# Patient Record
Sex: Female | Born: 2000 | Race: Black or African American | Hispanic: No | Marital: Single | Smoking: Never smoker
Health system: Southern US, Community
[De-identification: ages and names within clinical notes are randomized; demographics above are authoritative.]

## PROBLEM LIST (undated history)

## (undated) DIAGNOSIS — F419 Anxiety disorder, unspecified: Secondary | ICD-10-CM

## (undated) HISTORY — PX: OTHER SURGICAL HISTORY: SHX169

## (undated) HISTORY — PX: NO PAST SURGERIES: SHX2092

## (undated) HISTORY — PX: WISDOM TOOTH EXTRACTION: SHX21

## (undated) HISTORY — DX: Anxiety disorder, unspecified: F41.9

---
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
## 2019-06-10 ENCOUNTER — Other Ambulatory Visit: Payer: Self-pay

## 2019-06-10 ENCOUNTER — Emergency Department (HOSPITAL_COMMUNITY)
Admission: EM | Admit: 2019-06-10 | Discharge: 2019-06-10 | Disposition: A | Discharge status: Home / Self Care | Payer: Medicaid Other | Attending: Emergency Medicine | Admitting: Emergency Medicine

## 2019-06-10 ENCOUNTER — Encounter (HOSPITAL_COMMUNITY): Payer: Self-pay | Admitting: Emergency Medicine

## 2019-06-10 DIAGNOSIS — B9689 Other specified bacterial agents as the cause of diseases classified elsewhere: Secondary | ICD-10-CM | POA: Diagnosis not present

## 2019-06-10 DIAGNOSIS — N72 Inflammatory disease of cervix uteri: Secondary | ICD-10-CM | POA: Insufficient documentation

## 2019-06-10 DIAGNOSIS — N898 Other specified noninflammatory disorders of vagina: Secondary | ICD-10-CM | POA: Diagnosis present

## 2019-06-10 DIAGNOSIS — N760 Acute vaginitis: Secondary | ICD-10-CM | POA: Diagnosis not present

## 2019-06-10 LAB — COMPREHENSIVE METABOLIC PANEL
ALT: 9 U/L (ref 0–44)
AST: 15 U/L (ref 15–41)
Albumin: 4.7 g/dL (ref 3.5–5.0)
Alkaline Phosphatase: 48 U/L (ref 38–126)
Anion gap: 11 (ref 5–15)
BUN: 8 mg/dL (ref 6–20)
CO2: 24 mmol/L (ref 22–32)
Calcium: 9.7 mg/dL (ref 8.9–10.3)
Chloride: 103 mmol/L (ref 98–111)
Creatinine, Ser: 0.85 mg/dL (ref 0.44–1.00)
GFR calc Af Amer: >60 mL/min (ref >60)
GFR calc non Af Amer: >60 mL/min (ref >60)
Glucose, Bld: 98 mg/dL (ref 70–99)
Potassium: 3.4 mmol/L — ABNORMAL LOW (ref 3.5–5.1)
Sodium: 138 mmol/L (ref 135–145)
Total Bilirubin: 0.8 mg/dL (ref 0.3–1.2)
Total Protein: 8 g/dL (ref 6.5–8.1)

## 2019-06-10 LAB — CBC WITH DIFFERENTIAL/PLATELET
Abs Immature Granulocytes: 0.01 10*3/uL (ref 0.00–0.07)
Basophils Absolute: 0 10*3/uL (ref 0.0–0.1)
Basophils Relative: 0 %
Eosinophils Absolute: 0 10*3/uL (ref 0.0–0.5)
Eosinophils Relative: 1 %
HCT: 38 % (ref 36.0–46.0)
Hemoglobin: 12.3 g/dL (ref 12.0–15.0)
Immature Granulocytes: 0 %
Lymphocytes Relative: 45 %
Lymphs Abs: 2.7 10*3/uL (ref 0.7–4.0)
MCH: 29.1 pg (ref 26.0–34.0)
MCHC: 32.4 g/dL (ref 30.0–36.0)
MCV: 89.8 fL (ref 80.0–100.0)
Monocytes Absolute: 0.6 10*3/uL (ref 0.1–1.0)
Monocytes Relative: 10 %
Neutro Abs: 2.7 10*3/uL (ref 1.7–7.7)
Neutrophils Relative %: 44 %
Platelets: 199 10*3/uL (ref 150–400)
RBC: 4.23 MIL/uL (ref 3.87–5.11)
RDW: 11.3 % — ABNORMAL LOW (ref 11.5–15.5)
WBC: 6.1 10*3/uL (ref 4.0–10.5)
nRBC: 0 % (ref 0.0–0.2)

## 2019-06-10 LAB — URINALYSIS, ROUTINE W REFLEX MICROSCOPIC
Bacteria, UA: NONE SEEN
Bilirubin Urine: NEGATIVE
Glucose, UA: NEGATIVE mg/dL
Hgb urine dipstick: NEGATIVE
Ketones, ur: 20 mg/dL — AB
Leukocytes,Ua: NEGATIVE
Nitrite: NEGATIVE
Protein, ur: 30 mg/dL — AB
Specific Gravity, Urine: 1.028 (ref 1.005–1.030)
pH: 5 (ref 5.0–8.0)

## 2019-06-10 LAB — WET PREP, GENITAL
Sperm: NONE SEEN
Trich, Wet Prep: NONE SEEN
Yeast Wet Prep HPF POC: NONE SEEN

## 2019-06-10 LAB — LIPASE, BLOOD: Lipase: 39 U/L (ref 11–51)

## 2019-06-10 LAB — I-STAT BETA HCG BLOOD, ED (MC, WL, AP ONLY): I-stat hCG, quantitative: <5 m[IU]/mL (ref <5)

## 2019-06-10 MED ORDER — MORPHINE SULFATE (PF) 4 MG/ML IV SOLN
4.0000 mg | Freq: Once | INTRAVENOUS | Status: DC
  Filled 2019-06-10: qty 1

## 2019-06-10 MED ORDER — AZITHROMYCIN 1 G PO PACK
1.0000 g | PACK | Freq: Once | ORAL | Status: AC
  Administered 2019-06-10: 1 g via ORAL
  Filled 2019-06-10: qty 1

## 2019-06-10 MED ORDER — LIDOCAINE HCL (PF) 1 % IJ SOLN
INTRAMUSCULAR | Status: AC
  Administered 2019-06-10: 1.5 mL
  Filled 2019-06-10: qty 5

## 2019-06-10 MED ORDER — CEFTRIAXONE SODIUM 250 MG IJ SOLR
250.0000 mg | Freq: Once | INTRAMUSCULAR | Status: AC
  Administered 2019-06-10: 250 mg via INTRAMUSCULAR
  Filled 2019-06-10: qty 250

## 2019-06-10 MED ORDER — KETOROLAC TROMETHAMINE 15 MG/ML IJ SOLN
15.0000 mg | Freq: Once | INTRAMUSCULAR | Status: AC
  Administered 2019-06-10: 15 mg via INTRAVENOUS
  Filled 2019-06-10: qty 1

## 2019-06-10 MED ORDER — SODIUM CHLORIDE 0.9 % IV BOLUS
1000.0000 mL | Freq: Once | INTRAVENOUS | Status: AC
  Administered 2019-06-10: 1000 mL via INTRAVENOUS

## 2019-06-10 MED ORDER — METRONIDAZOLE 500 MG PO TABS: 500.0000 mg | ORAL_TABLET | Freq: Two times a day (BID) | ORAL | 0 refills | Status: DC

## 2019-06-10 MED ORDER — ONDANSETRON HCL 4 MG/2ML IJ SOLN
4.0000 mg | Freq: Once | INTRAMUSCULAR | Status: AC
  Administered 2019-06-10: 4 mg via INTRAVENOUS
  Filled 2019-06-10: qty 2

## 2019-06-10 MED ORDER — DOXYCYCLINE HYCLATE 100 MG PO CAPS: 100.0000 mg | ORAL_CAPSULE | Freq: Two times a day (BID) | ORAL | 0 refills | Status: AC

## 2019-06-10 NOTE — ED Triage Notes (Signed)
Pt reports that she had stomach pain that started yesterday(6/18) morning, Progressively worse today. With Nausea, one episode of vomiting. Afebrile.

## 2019-06-10 NOTE — ED Provider Notes (Signed)
[facility] EMERGENCY DEPARTMENT Provider Note   CSN: [id number] Arrival date & time: 06/10/19  0143    History   Chief Complaint Chief Complaint  Patient presents with  . Abdominal Pain    HPI Brianna Ellis is a 19 y.o. female.     18 yo F with a chief complaints of diffuse abdominal pain and low back pain.  This been going on since yesterday.  Had one episode of nausea and vomiting.  Denies fevers denies diarrhea.  Denies constipation denies urinary symptoms.  Pain is described as constant.  Throbbing.  Denies vaginal bleeding or discharge.  Unsure if she could be pregnant.  The history is provided by the patient.  Abdominal Pain Pain location:  Generalized Pain quality: aching and throbbing   Pain radiates to:  Back Pain severity:  Moderate Onset quality:  Gradual Duration:  2 days Timing:  Constant Progression:  Worsening Chronicity:  New Relieved by:  Nothing Worsened by:  Nothing Ineffective treatments:  None tried Associated symptoms: nausea and vomiting (x1 now resolved)   Associated symptoms: no chest pain, no chills, no dysuria, no fever and no shortness of breath     History reviewed. No pertinent past medical history.  There are no active problems to display for this patient.   Past Surgical History:  Procedure Laterality Date  . NO PAST SURGERIES       OB History    Gravida  0   Para  0   Term  0   Preterm  0   AB  0   Living  0     SAB  0   TAB  0   Ectopic  0   Multiple  0   Live Births               Home Medications    Prior to Admission medications   Medication Sig Start Date End Date Taking? Authorizing Provider  doxycycline (VIBRAMYCIN) 100 MG capsule Take 1 capsule (100 mg total) by mouth 2 (two) times daily for 14 days. 06/10/19 06/24/19  Deno Etienne, DO  ibuprofen (ADVIL,MOTRIN) 800 MG tablet Take 1 tablet (800 mg total) by mouth every 8 (eight) hours as needed. 06/11/15   Beers, Pierce Crane, PA-C   metroNIDAZOLE (FLAGYL) 500 MG tablet Take 1 tablet (500 mg total) by mouth 2 (two) times daily. 06/10/19   Deno Etienne, DO    Family History History reviewed. No pertinent family history.  Social History Social History   Tobacco Use  . Smoking status: Never Smoker  . Smokeless tobacco: Never Used  Substance Use Topics  . Alcohol use: Not Currently  . Drug use: Not Currently     Allergies   Patient has no known allergies.   Review of Systems Review of Systems  Constitutional: Negative for chills and fever.  HENT: Negative for congestion and rhinorrhea.   Eyes: Negative for redness and visual disturbance.  Respiratory: Negative for shortness of breath and wheezing.   Cardiovascular: Negative for chest pain and palpitations.  Gastrointestinal: Positive for abdominal pain, nausea and vomiting (x1 now resolved).  Genitourinary: Negative for dysuria and urgency.  Musculoskeletal: Negative for arthralgias and myalgias.  Skin: Negative for pallor and wound.  Neurological: Negative for dizziness and headaches.     Physical Exam Updated Vital Signs BP 132/78 (BP Location: Right Arm)   Pulse 70   Temp 98 F (36.7 C) (Oral)   Resp 16   Ht 5'  7" (1.702 m)   Wt 62.6 kg   SpO2 100%   BMI 21.61 kg/m   Physical Exam Vitals signs and nursing note reviewed.  Constitutional:      General: She is not in acute distress.    Appearance: She is well-developed. She is not diaphoretic.  HENT:     Head: Normocephalic and atraumatic.  Eyes:     Pupils: Pupils are equal, round, and reactive to light.  Neck:     Musculoskeletal: Normal range of motion and neck supple.  Cardiovascular:     Rate and Rhythm: Normal rate and regular rhythm.     Heart sounds: No murmur. No friction rub. No gallop.   Pulmonary:     Effort: Pulmonary effort is normal.     Breath sounds: No wheezing or rales.  Abdominal:     General: There is no distension.     Palpations: Abdomen is soft.      Tenderness: There is abdominal tenderness (mild diffuse, worst to the suprapubic region).  Genitourinary:    Vagina: Vaginal discharge present.     Cervix: Cervical motion tenderness present.     Comments: + chandelier sign Musculoskeletal:        General: No tenderness.  Skin:    General: Skin is warm and dry.  Neurological:     Mental Status: She is alert and oriented to person, place, and time.  Psychiatric:        Behavior: Behavior normal.      ED Treatments / Results  Labs (all labs ordered are listed, but only abnormal results are displayed) Labs Reviewed  WET PREP, GENITAL - Abnormal; Notable for the following components:      Result Value   Clue Cells Wet Prep HPF POC PRESENT (*)    WBC, Wet Prep HPF POC FEW (*)    All other components within normal limits  CBC WITH DIFFERENTIAL/PLATELET - Abnormal; Notable for the following components:   RDW 11.3 (*)    All other components within normal limits  COMPREHENSIVE METABOLIC PANEL - Abnormal; Notable for the following components:   Potassium 3.4 (*)    All other components within normal limits  URINALYSIS, ROUTINE W REFLEX MICROSCOPIC - Abnormal; Notable for the following components:   Ketones, ur 20 (*)    Protein, ur 30 (*)    All other components within normal limits  LIPASE, BLOOD  I-STAT BETA HCG BLOOD, ED ([facility], [facility], [facility] ONLY)  GC/CHLAMYDIA PROBE AMP ([place]) NOT AT [facility]    EKG None  Radiology No results found.  Procedures Procedures (including critical care time)  Medications Ordered in ED Medications  morphine 4 MG/ML injection 4 mg (has no administration in time range)  sodium chloride 0.9 % bolus 1,000 mL (1,000 mLs Intravenous New Bag/Given 06/10/19 0303)  ondansetron (ZOFRAN) injection 4 mg (4 mg Intravenous Given 06/10/19 0257)  cefTRIAXone (ROCEPHIN) injection 250 mg (250 mg Intramuscular Given 06/10/19 0311)  azithromycin (ZITHROMAX) powder 1 g (1 g Oral Given 06/10/19 0306)  ketorolac  (TORADOL) 15 MG/ML injection 15 mg (15 mg Intravenous Given 06/10/19 0259)  lidocaine (PF) (XYLOCAINE) 1 % injection (1.5 mLs  Given 06/10/19 0316)     Initial Impression / Assessment and Plan / ED Course  I have reviewed the triage vital signs and the nursing notes.  Pertinent labs & imaging results that were available during my care of the patient were reviewed by me and considered in my medical decision making (see chart  for details).        18 yo F with a chief complaint of diffuse abdominal pain.  On exam it localizes more to the suprapubic region.  Will obtain a urine lab work perform a pelvic exam and reassess.   Pelvic with copious discharge and CMT.  Will treat for PID.   Patient also with BV.  UA is negative for infection and patient is not pregnant.  LFTs and lipase are unremarkable.  No leukocytosis.  3:17 AM:  I have discussed the diagnosis/risks/treatment options with the patient and believe the pt to be eligible for discharge home to follow-up with PCP. We also discussed returning to the ED immediately if new or worsening sx occur. We discussed the sx which are most concerning (e.g., sudden worsening pain, fever, inability to tolerate by mouth) that necessitate immediate return. Medications administered to the patient during their visit and any new prescriptions provided to the patient are listed below.  Medications given during this visit Medications  morphine 4 MG/ML injection 4 mg (has no administration in time range)  sodium chloride 0.9 % bolus 1,000 mL (1,000 mLs Intravenous New Bag/Given 06/10/19 0303)  ondansetron (ZOFRAN) injection 4 mg (4 mg Intravenous Given 06/10/19 0257)  cefTRIAXone (ROCEPHIN) injection 250 mg (250 mg Intramuscular Given 06/10/19 0311)  azithromycin (ZITHROMAX) powder 1 g (1 g Oral Given 06/10/19 0306)  ketorolac (TORADOL) 15 MG/ML injection 15 mg (15 mg Intravenous Given 06/10/19 0259)  lidocaine (PF) (XYLOCAINE) 1 % injection (1.5 mLs  Given  06/10/19 0316)     The patient appears reasonably screen and/or stabilized for discharge and I doubt any other medical condition or other [facility] requiring further screening, evaluation, or treatment in the ED at this time prior to discharge.   Final Clinical Impressions(s) / ED Diagnoses   Final diagnoses:  Cervicitis  BV (bacterial vaginosis)    ED Discharge Orders         Ordered    metroNIDAZOLE (FLAGYL) 500 MG tablet  2 times daily     06/10/19 0315    doxycycline (VIBRAMYCIN) 100 MG capsule  2 times daily     06/10/19 0315           Deno Etienne, DO 06/10/19 [phone]

## 2019-06-10 NOTE — Discharge Instructions (Signed)
Take 4 over the counter ibuprofen tablets 3 times a day or 2 over-the-counter naproxen tablets twice a day for pain. Also take tylenol 1000mg (2 extra strength) four times a day.   Return for fever, worsening pain, inability to eat or drink

## 2019-06-12 LAB — GC/CHLAMYDIA PROBE AMP (~~LOC~~) NOT AT ARMC
Chlamydia: NEGATIVE
Neisseria Gonorrhea: NEGATIVE

## 2019-10-23 ENCOUNTER — Emergency Department
Admission: EM | Admit: 2019-10-23 | Discharge: 2019-10-23 | Disposition: A | Discharge status: Home / Self Care | Payer: Medicaid Other | Attending: Student | Admitting: Student

## 2019-10-23 ENCOUNTER — Other Ambulatory Visit: Payer: Self-pay

## 2019-10-23 ENCOUNTER — Encounter: Payer: Self-pay | Admitting: Emergency Medicine

## 2019-10-23 ENCOUNTER — Emergency Department: Payer: Medicaid Other

## 2019-10-23 DIAGNOSIS — Y9389 Activity, other specified: Secondary | ICD-10-CM | POA: Diagnosis not present

## 2019-10-23 DIAGNOSIS — Z79899 Other long term (current) drug therapy: Secondary | ICD-10-CM | POA: Diagnosis not present

## 2019-10-23 DIAGNOSIS — Y999 Unspecified external cause status: Secondary | ICD-10-CM | POA: Diagnosis not present

## 2019-10-23 DIAGNOSIS — Y92810 Car as the place of occurrence of the external cause: Secondary | ICD-10-CM | POA: Insufficient documentation

## 2019-10-23 DIAGNOSIS — S63632A Sprain of interphalangeal joint of right middle finger, initial encounter: Secondary | ICD-10-CM | POA: Diagnosis not present

## 2019-10-23 DIAGNOSIS — S6991XA Unspecified injury of right wrist, hand and finger(s), initial encounter: Secondary | ICD-10-CM | POA: Diagnosis not present

## 2019-10-23 DIAGNOSIS — W231XXA Caught, crushed, jammed, or pinched between stationary objects, initial encounter: Secondary | ICD-10-CM | POA: Diagnosis not present

## 2019-10-23 MED ORDER — MELOXICAM 7.5 MG PO TABS: 7.5000 mg | ORAL_TABLET | Freq: Every day | ORAL | 0 refills | Status: AC

## 2019-10-23 MED ORDER — MELOXICAM 7.5 MG PO TABS
7.5000 mg | ORAL_TABLET | Freq: Once | ORAL | Status: AC
  Administered 2019-10-23: 18:00:00 7.5 mg via ORAL
  Filled 2019-10-23: qty 1

## 2019-10-23 NOTE — ED Provider Notes (Signed)
[facility] Emergency Department Provider Note ____________________________________________  Time seen: Approximately 7:44 PM  I have reviewed the triage vital signs and the nursing notes.   HISTORY  Chief Complaint Finger Injury    HPI Brianna Ellis is a 18 y.o. female who presents to the emergency department for evaluation and treatment of right middle finger injury on Sept. 23, 2020. She states that she slammed her finger in the car door.  She states that she put the finger in a splint for a few days after that and then took it off.  She has taken some ibuprofen intermittently since that time as well.  She states that the pain just does not seem to go away completely.  She did not get an x-ray after the injury and this is the first time she is been evaluated for this pain.  History reviewed. No pertinent past medical history.  There are no active problems to display for this patient.   Past Surgical History:  Procedure Laterality Date  . NO PAST SURGERIES      Prior to Admission medications   Medication Sig Start Date End Date Taking? Authorizing Provider  ibuprofen (ADVIL,MOTRIN) 800 MG tablet Take 1 tablet (800 mg total) by mouth every 8 (eight) hours as needed. 06/11/15   Beers, Pierce Crane, PA-C  meloxicam (MOBIC) 7.5 MG tablet Take 1 tablet (7.5 mg total) by mouth daily. 10/23/19 11/22/19  Victorino Dike, FNP    Allergies Patient has no known allergies.  No family history on file.  Social History Social History   Tobacco Use  . Smoking status: Never Smoker  . Smokeless tobacco: Never Used  Substance Use Topics  . Alcohol use: Not Currently  . Drug use: Not Currently    Review of Systems Constitutional: Negative for fever. Cardiovascular: Negative for chest pain. Respiratory: Negative for shortness of breath. Musculoskeletal: Positive for right middle finger pain. Skin: Negative for open wounds or lesions. Neurological: Negative for  decrease in sensation  ____________________________________________   PHYSICAL EXAM:  VITAL SIGNS: ED Triage Vitals  Enc Vitals Group     BP 10/23/19 1606 120/78     Pulse Rate 10/23/19 1606 72     Resp 10/23/19 1606 18     Temp 10/23/19 1606 98 F (36.7 C)     Temp Source 10/23/19 1606 Oral     SpO2 10/23/19 1606 98 %     Weight 10/23/19 1554 135 lb (61.2 kg)     Height 10/23/19 1554 5\' 7"  (1.702 m)     Head Circumference --      Peak Flow --      Pain Score 10/23/19 1554 6     Pain Loc --      Pain Edu? --      Excl. in [place]? --     Constitutional: Alert and oriented. Well appearing and in no acute distress. Eyes: Conjunctivae are clear without discharge or drainage Head: Atraumatic Neck: Supple. Respiratory: No cough. Respirations are even and unlabored. Musculoskeletal: Patient is able to demonstrate flexion and extension of the right middle finger but states that pain increases with flexion.  No obvious deformity is noted. Neurologic: Motor and sensory function is intact. Skin: No open wounds or lesions noted over the right hand Psychiatric: Affect and behavior are appropriate.  ____________________________________________   LABS (all labs ordered are listed, but only abnormal results are displayed)  Labs Reviewed - No data to display ____________________________________________  RADIOLOGY  Image  of the right middle finger shows no acute bony abnormality per radiology. ____________________________________________   PROCEDURES  Procedures  ____________________________________________   INITIAL IMPRESSION / ASSESSMENT AND PLAN / ED COURSE  Brianna Ellis is a 18 y.o. who presents to the emergency department for evaluation of right middle finger pain after slamming it in a car door September 13, 2019.  X-ray and evaluation are consistent.  No fracture noted.  She will be placed in an aluminum foam static finger splint and encouraged to follow-up with  orthopedics if not improving over the next couple of weeks.  She will be given a prescription for meloxicam for which she is supposed to take every day.  She was encouraged to return to the emergency department for symptoms of change or worsen if she is unable to see primary care or orthopedics.   Medications  meloxicam (MOBIC) tablet 7.5 mg (7.5 mg Oral Given 10/23/19 1735)    Pertinent labs & imaging results that were available during my care of the patient were reviewed by me and considered in my medical decision making (see chart for details).  _________________________________________   FINAL CLINICAL IMPRESSION(S) / ED DIAGNOSES  Final diagnoses:  Sprain of interphalangeal joint of right middle finger, initial encounter    ED Discharge Orders         Ordered    meloxicam (MOBIC) 7.5 MG tablet  Daily     10/23/19 1725           If controlled substance prescribed during this visit, 12 month history viewed on the [place] prior to issuing an initial prescription for Schedule II or III opiod.   Victorino Dike, FNP 10/23/19 2317    Lilia Pro., MD 10/23/19 [phone]

## 2019-10-23 NOTE — ED Triage Notes (Signed)
Pt reports slammed her right hand middle finger in a car door in September. Pt states she put her own splint on it but it never got better.

## 2019-10-23 NOTE — Discharge Instructions (Signed)
Take the meloxicam daily.  Wear the finger splint for the next 2 weeks.  Follow up with your primary care provider if not improving over the next 2 weeks.

## 2019-10-23 NOTE — ED Notes (Signed)
See triage note  Presents with right middle finger pain  States she shut it in a door about 1 month ago  conts to have pain

## 2020-01-04 ENCOUNTER — Telehealth: Payer: Self-pay

## 2020-01-04 NOTE — Telephone Encounter (Signed)
Copied from [place] [phone]. Topic: General - Other >> Jan 04, 2020  3:08 PM Celene Kras wrote: Reason for CRM: Pts mother called stating that she is wanting to have the pt seen by Fenton Malling. Pts other states that Tawanna Sat sees all of the pts family. Please advise.

## 2020-01-04 NOTE — Telephone Encounter (Signed)
Open in error

## 2020-01-04 NOTE — Telephone Encounter (Deleted)
Copied from [place] [id number]. Topic: General - Other >> Jan 04, 2020  3:08 PM Celene Kras wrote: Reason for CRM: Pts mother called stating that she is wanting to have the pt seen by Fenton Malling. Pts other states that Tawanna Sat sees all of the pts family. Please advise.

## 2020-01-04 NOTE — Telephone Encounter (Signed)
Yes she can establish with me from [place]

## 2020-01-04 NOTE — Telephone Encounter (Signed)
No note, I dont know what is needed

## 2020-01-04 NOTE — Telephone Encounter (Signed)
Source Subject Topic  Brianna Ellis (Patient) Brianna Ellis (Patient) General - Other  Reason for CRM: Pts mother called stating that she is wanting to have the pt seen by Fenton Malling. Pts other states that Tawanna Sat sees all of the pts family. Please advise.

## 2020-01-04 NOTE — Telephone Encounter (Signed)
Copied from [place] [id number]. Topic: General - Other >> Jan 04, 2020  3:08 PM Celene Kras wrote: Reason for CRM: Pts mother called stating that she is wanting to have the pt seen by Fenton Malling. Pts other states that Tawanna Sat sees all of the pts family. Please advise.

## 2020-01-18 NOTE — Progress Notes (Signed)
Patient: Brianna Ellis Female    DOB: Oct 19, 2001   19 y.o.   MRN: [id number] Visit Date: 01/22/2020  Today's Provider: Mar Daring, PA-C   Chief Complaint  Patient presents with  . Obesity  . Contraception   Subjective:     HPI  Patient to establish care with a new provider. She used to see Carmon Ginsberg here.   Patient here with c/o cramping from her "butt" radiating to her stomach then to her back. She reports that it has gone away but it lasted 3 days. Was associated around her menstrual cycle.   She also wants to talk about weight loss counseling. She lost 20 lbs last year. She was not eating a lot of meals. Current BMI is 22.81. She does report to eating an unhealthy diet.   She also would like to get started on some type contraception management. LMP: 3-4 weeks ago. Her periods are regular. She is sexually active.  No Known Allergies   Current Outpatient Medications:  .  ibuprofen (ADVIL,MOTRIN) 800 MG tablet, Take 1 tablet (800 mg total) by mouth every 8 (eight) hours as needed., Disp: 30 tablet, Rfl: 0 .  Norethindrone Acetate-Ethinyl Estrad-FE (LOESTRIN 24 FE) 1-20 MG-MCG(24) tablet, Take 1 tablet by mouth daily., Disp: 3 Package, Rfl: 4  Review of Systems  Constitutional: Negative.   Respiratory: Negative.   Cardiovascular: Negative.   Gastrointestinal: Negative.   Genitourinary: Negative.   Psychiatric/Behavioral: Negative.     Social History   Tobacco Use  . Smoking status: Never Smoker  . Smokeless tobacco: Never Used  Substance Use Topics  . Alcohol use: Not Currently      Objective:   BP 100/62 (BP Location: Left Arm, Patient Position: Sitting, Cuff Size: Normal)   Pulse 80   Temp (!) 97.2 F (36.2 C) (Temporal)   Resp 16   Ht 5\' 8"  (1.727 m)   Wt 150 lb (68 kg)   LMP  (Within Weeks)   BMI 22.81 kg/m  Vitals:   01/19/20 0708  BP: 100/62  Pulse: 80  Resp: 16  Temp: (!) 97.2 F (36.2 C)  TempSrc: Temporal  Weight: 150  lb (68 kg)  Height: 5\' 8"  (1.727 m)  Body mass index is 22.81 kg/m.   Physical Exam Vitals reviewed.  Constitutional:      General: She is not in acute distress.    Appearance: Normal appearance. She is well-developed. She is not ill-appearing or diaphoretic.  Neck:     Thyroid: No thyromegaly.     Vascular: No JVD.     Trachea: No tracheal deviation.  Cardiovascular:     Rate and Rhythm: Normal rate and regular rhythm.     Pulses: Normal pulses.     Heart sounds: Normal heart sounds. No murmur. No friction rub. No gallop.   Pulmonary:     Effort: Pulmonary effort is normal. No respiratory distress.     Breath sounds: Normal breath sounds. No wheezing or rales.  Abdominal:     General: Abdomen is flat.     Palpations: Abdomen is soft.     Tenderness: There is no abdominal tenderness.  Musculoskeletal:     Cervical back: Normal range of motion and neck supple.  Lymphadenopathy:     Cervical: No cervical adenopathy.  Neurological:     Mental Status: She is alert.  Psychiatric:        Mood and Affect: Mood normal.  Behavior: Behavior normal.        Thought Content: Thought content normal.        Judgment: Judgment normal.      Results for orders placed or performed in visit on 01/19/20  POCT urine pregnancy  Result Value Ref Range   Preg Test, Ur Negative Negative       Assessment & Plan    1. Encounter for counseling regarding contraception Urine pregnancy negative. Discussed all forms of contraception and decided to start with OCP. LoEstrin prescribed as below.  - Norethindrone Acetate-Ethinyl Estrad-FE (LOESTRIN 24 FE) 1-20 MG-MCG(24) tablet; Take 1 tablet by mouth daily.  Dispense: 3 Package; Refill: 4  2. Dietary counseling Long discussion with healthy dieting options. Discussed trying to eat more fruits and vegetables and increasing water intake. Discussed [email or web address] website for more information for patient.      Mar Daring, PA-C    [facility]

## 2020-01-19 ENCOUNTER — Ambulatory Visit (INDEPENDENT_AMBULATORY_CARE_PROVIDER_SITE_OTHER): Payer: Medicaid Other | Admitting: Physician Assistant

## 2020-01-19 ENCOUNTER — Encounter: Payer: Self-pay | Admitting: Physician Assistant

## 2020-01-19 ENCOUNTER — Other Ambulatory Visit: Payer: Self-pay

## 2020-01-19 VITALS — BP 100/62 | HR 80 | Temp 97.2°F | Resp 16 | Ht 68.0 in | Wt 150.0 lb

## 2020-01-19 DIAGNOSIS — Z713 Dietary counseling and surveillance: Secondary | ICD-10-CM

## 2020-01-19 DIAGNOSIS — Z3009 Encounter for other general counseling and advice on contraception: Secondary | ICD-10-CM

## 2020-01-19 LAB — POCT URINE PREGNANCY: Preg Test, Ur: NEGATIVE

## 2020-01-19 MED ORDER — NORETHIN ACE-ETH ESTRAD-FE 1-20 MG-MCG(24) PO TABS: 1.0000 | ORAL_TABLET | Freq: Every day | ORAL | 4 refills | Status: DC

## 2020-01-19 NOTE — Patient Instructions (Signed)
Ethinyl Estradiol; Norethindrone Acetate; Ferrous fumarate tablets or capsules What is this medicine? ETHINYL ESTRADIOL; NORETHINDRONE ACETATE; FERROUS FUMARATE (ETH in il es tra DYE ole; nor eth IN drone AS e tate; FER [place] FUE ma rate) is an oral contraceptive. The products combine two types of female hormones, an estrogen and a progestin. They are used to prevent ovulation and pregnancy. Some products are also used to treat acne in females. This medicine may be used for other purposes; ask your health care provider or pharmacist if you have questions. COMMON BRAND NAME(S): Aurovela [address] 1/20, Aurovela Fe, [place] [address] Fe, Estrostep Fe, Gildess 24 Fe, Gildess Fe 1.5/30, Gildess Fe 1/20, Hailey 24 Fe, Hailey Fe 1.5/30, Junel Fe 1.5/30, Junel Fe 1/20, Junel Fe 24, Larin Fe, Lo Loestrin Fe, Loestrin 24 Fe, Loestrin FE 1.5/30, Loestrin FE 1/20, Lomedia 24 Fe, Microgestin 24 Fe, Microgestin Fe 1.5/30, Microgestin Fe 1/20, Tarina 24 Fe, Tarina Fe 1/20, Taytulla, Tilia Fe, Tri-Legest Fe What should I tell my health care provider before I take this medicine? They need to know if you have any of these conditions:  abnormal vaginal bleeding  blood vessel disease  breast, cervical, endometrial, ovarian, liver, or uterine cancer  diabetes  gallbladder disease  heart disease or recent heart attack  high blood pressure  high cholesterol  history of blood clots  kidney disease  liver disease  migraine headaches  smoke tobacco  stroke  systemic lupus erythematosus (SLE)  an unusual or allergic reaction to estrogens, progestins, other medicines, foods, dyes, or preservatives  pregnant or trying to get pregnant  breast-feeding How should I use this medicine? Take this medicine by mouth. To reduce nausea, this medicine may be taken with food. Follow the directions on the prescription label. Take this medicine at the same time each day and in the order directed on the package. Do  not take your medicine more often than directed. A patient package insert for the product will be given with each prescription and refill. Read this sheet carefully each time. The sheet may change frequently. Contact your pediatrician regarding the use of this medicine in children. Special care may be needed. This medicine has been used in female children who have started having menstrual periods. Overdosage: If you think you have taken too much of this medicine contact a poison control center or emergency room at once. NOTE: This medicine is only for you. Do not share this medicine with others. What if I miss a dose? If you miss a dose, refer to the patient information sheet you received with your medicine for direction. If you miss more than one pill, this medicine may not be as effective and you may need to use another form of birth control. What may interact with this medicine? Do not take this medicine with the following medication:  dasabuvir; ombitasvir; paritaprevir; ritonavir  ombitasvir; paritaprevir; ritonavir This medicine may also interact with the following medications:  acetaminophen  antibiotics or medicines for infections, especially rifampin, rifabutin, rifapentine, and griseofulvin, and possibly penicillins or tetracyclines  aprepitant  ascorbic acid (vitamin C)  atorvastatin  barbiturate medicines, such as phenobarbital  bosentan  carbamazepine  caffeine  clofibrate  cyclosporine  dantrolene  doxercalciferol  felbamate  grapefruit juice  hydrocortisone  medicines for anxiety or sleeping problems, such as diazepam or temazepam  medicines for diabetes, including pioglitazone  mineral oil  modafinil  mycophenolate  nefazodone  oxcarbazepine  phenytoin  prednisolone  ritonavir or other medicines for  HIV infection or AIDS  rosuvastatin  selegiline  soy isoflavones supplements  St. John's wort  tamoxifen or  raloxifene  theophylline  thyroid hormones  topiramate  warfarin This list may not describe all possible interactions. Give your health care provider a list of all the medicines, herbs, non-prescription drugs, or dietary supplements you use. Also tell them if you smoke, drink alcohol, or use illegal drugs. Some items may interact with your medicine. What should I watch for while using this medicine? Visit your doctor or health care professional for regular checks on your progress. You will need a regular breast and pelvic exam and Pap smear while on this medicine. Use an additional method of contraception during the first cycle that you take these tablets. If you have any reason to think you are pregnant, stop taking this medicine right away and contact your doctor or health care professional. If you are taking this medicine for hormone related problems, it may take several cycles of use to see improvement in your condition. Smoking increases the risk of getting a blood clot or having a stroke while you are taking birth control pills, especially if you are more than 20 years old. You are strongly advised not to smoke. This medicine can make your body retain fluid, making your fingers, hands, or ankles swell. Your blood pressure can go up. Contact your doctor or health care professional if you feel you are retaining fluid. This medicine can make you more sensitive to the sun. Keep out of the sun. If you cannot avoid being in the sun, wear protective clothing and use sunscreen. Do not use sun lamps or tanning beds/booths. If you wear contact lenses and notice visual changes, or if the lenses begin to feel uncomfortable, consult your eye care specialist. In some women, tenderness, swelling, or minor bleeding of the gums may occur. Notify your dentist if this happens. Brushing and flossing your teeth regularly may help limit this. See your dentist regularly and inform your dentist of the medicines you  are taking. If you are going to have elective surgery, you may need to stop taking this medicine before the surgery. Consult your health care professional for advice. This medicine does not protect you against HIV infection (AIDS) or any other sexually transmitted diseases. What side effects may I notice from receiving this medicine? Side effects that you should report to your doctor or health care professional as soon as possible:  allergic reactions like skin rash, itching or hives, swelling of the face, lips, or tongue  breast tissue changes or discharge  changes in vaginal bleeding during your period or between your periods  changes in vision  chest pain  confusion  coughing up blood  dizziness  feeling faint or lightheaded  headaches or migraines  leg, arm or groin pain  loss of balance or coordination  severe or sudden headaches  stomach pain (severe)  sudden shortness of breath  sudden numbness or weakness of the face, arm or leg  symptoms of vaginal infection like itching, irritation or unusual discharge  tenderness in the upper abdomen  trouble speaking or understanding  vomiting  yellowing of the eyes or skin Side effects that usually do not require medical attention (report to your doctor or health care professional if they continue or are bothersome):  breakthrough bleeding and spotting that continues beyond the 3 initial cycles of pills  breast tenderness  mood changes, anxiety, depression, frustration, anger, or emotional outbursts  increased sensitivity to sun  or ultraviolet light  nausea  skin rash, acne, or brown spots on the skin  weight gain (slight) This list may not describe all possible side effects. Call your doctor for medical advice about side effects. You may report side effects to FDA at 1-800-FDA-1088. Where should I keep my medicine? Keep out of the reach of children. Store at room temperature between 15 and 30 degrees C  (59 and 86 degrees F). Throw away any unused medicine after the expiration date. NOTE: This sheet is a summary. It may not cover all possible information. If you have questions about this medicine, talk to your doctor, pharmacist, or health care provider.  2020 Elsevier/Gold Standard (2016-08-17 08:04:41)   Mediterranean Diet A Mediterranean diet refers to food and lifestyle choices that are based on the traditions of countries located on the [employer]. This way of eating has been shown to help prevent certain conditions and improve outcomes for people who have chronic diseases, like kidney disease and heart disease. What are tips for following this plan? Lifestyle  Cook and eat meals together with your family, when possible.  Drink enough fluid to keep your urine clear or pale yellow.  Be physically active every day. This includes: ? Aerobic exercise like running or swimming. ? Leisure activities like gardening, walking, or housework.  Get 7-8 hours of sleep each night.  If recommended by your health care provider, drink red wine in moderation. This means 1 glass a day for nonpregnant women and 2 glasses a day for men. A glass of wine equals 5 oz (150 mL). Reading food labels   Check the serving size of packaged foods. For foods such as rice and pasta, the serving size refers to the amount of cooked product, not dry.  Check the total fat in packaged foods. Avoid foods that have saturated fat or trans fats.  Check the ingredients list for added sugars, such as corn syrup. Shopping  At the grocery store, buy most of your food from the areas near the walls of the store. This includes: ? Fresh fruits and vegetables (produce). ? Grains, beans, nuts, and seeds. Some of these may be available in unpackaged forms or large amounts (in bulk). ? Fresh seafood. ? Poultry and eggs. ? Low-fat dairy products.  Buy whole ingredients instead of prepackaged foods.  Buy fresh fruits  and vegetables in-season from local farmers markets.  Buy frozen fruits and vegetables in resealable bags.  If you do not have access to quality fresh seafood, buy precooked frozen shrimp or canned fish, such as tuna, salmon, or sardines.  Buy small amounts of raw or cooked vegetables, salads, or olives from the deli or salad bar at your store.  Stock your pantry so you always have certain foods on hand, such as olive oil, canned tuna, canned tomatoes, rice, pasta, and beans. Cooking  Cook foods with extra-virgin olive oil instead of using butter or other vegetable oils.  Have meat as a side dish, and have vegetables or grains as your main dish. This means having meat in small portions or adding small amounts of meat to foods like pasta or stew.  Use beans or vegetables instead of meat in common dishes like chili or lasagna.  Experiment with different cooking methods. Try roasting or broiling vegetables instead of steaming or sauteing them.  Add frozen vegetables to soups, stews, pasta, or rice.  Add nuts or seeds for added healthy fat at each meal. You can add these to yogurt,  salads, or vegetable dishes.  Marinate fish or vegetables using olive oil, lemon juice, garlic, and fresh herbs. Meal planning   Plan to eat 1 vegetarian meal one day each week. Try to work up to 2 vegetarian meals, if possible.  Eat seafood 2 or more times a week.  Have healthy snacks readily available, such as: ? Vegetable sticks with hummus. ? [place] yogurt. ? Fruit and nut trail mix.  Eat balanced meals throughout the week. This includes: ? Fruit: 2-3 servings a day ? Vegetables: 4-5 servings a day ? Low-fat dairy: 2 servings a day ? Fish, poultry, or lean meat: 1 serving a day ? Beans and legumes: 2 or more servings a week ? Nuts and seeds: 1-2 servings a day ? Whole grains: 6-8 servings a day ? Extra-virgin olive oil: 3-4 servings a day  Limit red meat and sweets to only a few servings a  month What are my food choices?  Mediterranean diet ? Recommended  Grains: Whole-grain pasta. Brown rice. Bulgar wheat. Polenta. Couscous. Whole-wheat bread. [place].  Vegetables: Artichokes. Beets. Broccoli. Cabbage. Carrots. Eggplant. Green beans. Chard. Kale. Spinach. Onions. Leeks. Peas. Squash. Tomatoes. Peppers. Radishes.  Fruits: Apples. Apricots. Avocado. Berries. Bananas. Cherries. Dates. Figs. Grapes. Lemons. Melon. Oranges. Peaches. Plums. Pomegranate.  Meats and other protein foods: Beans. Almonds. Sunflower seeds. Pine nuts. Peanuts. [place]. Salmon. Scallops. Shrimp. [place]. Tilapia. Clams. Oysters. Eggs.  Dairy: Low-fat milk. Cheese. Greek yogurt.  Beverages: Water. Red wine. Herbal tea.  Fats and oils: Extra virgin olive oil. Avocado oil. Grape seed oil.  Sweets and desserts: [place] yogurt with honey. Baked apples. Poached pears. Trail mix.  Seasoning and other foods: Basil. Cilantro. Coriander. Cumin. Mint. Parsley. Sage. Rosemary. Tarragon. Garlic. Oregano. Thyme. Pepper. Balsalmic vinegar. Tahini. Hummus. Tomato sauce. Olives. Mushrooms. ? Limit these  Grains: Prepackaged pasta or rice dishes. Prepackaged cereal with added sugar.  Vegetables: Deep fried potatoes (french fries).  Fruits: Fruit canned in syrup.  Meats and other protein foods: Beef. Pork. Lamb. Poultry with skin. Hot dogs. Berniece Salines.  Dairy: Ice cream. Sour cream. Whole milk.  Beverages: Juice. Sugar-sweetened soft drinks. Beer. Liquor and spirits.  Fats and oils: Butter. Canola oil. Vegetable oil. Beef fat (tallow). Lard.  Sweets and desserts: Cookies. Cakes. Pies. Candy.  Seasoning and other foods: Mayonnaise. Premade sauces and marinades. The items listed may not be a complete list. Talk with your dietitian about what dietary choices are right for you. Summary  The Mediterranean diet includes both food and lifestyle choices.  Eat a variety of fresh fruits and vegetables, beans, nuts,  seeds, and whole grains.  Limit the amount of red meat and sweets that you eat.  Talk with your health care provider about whether it is safe for you to drink red wine in moderation. This means 1 glass a day for nonpregnant women and 2 glasses a day for men. A glass of wine equals 5 oz (150 mL). This information is not intended to replace advice given to you by your health care provider. Make sure you discuss any questions you have with your health care provider. Document Revised: 08/06/2016 Document Reviewed: 07/30/2016 Elsevier Patient Education  [place].

## 2020-04-05 ENCOUNTER — Other Ambulatory Visit: Payer: Self-pay

## 2020-04-05 DIAGNOSIS — I871 Compression of vein: Secondary | ICD-10-CM | POA: Diagnosis not present

## 2020-04-05 DIAGNOSIS — R110 Nausea: Secondary | ICD-10-CM | POA: Insufficient documentation

## 2020-04-05 DIAGNOSIS — R109 Unspecified abdominal pain: Secondary | ICD-10-CM | POA: Diagnosis not present

## 2020-04-05 DIAGNOSIS — R1030 Lower abdominal pain, unspecified: Secondary | ICD-10-CM | POA: Diagnosis not present

## 2020-04-05 DIAGNOSIS — R140 Abdominal distension (gaseous): Secondary | ICD-10-CM | POA: Insufficient documentation

## 2020-04-05 LAB — URINALYSIS, COMPLETE (UACMP) WITH MICROSCOPIC
Bacteria, UA: NONE SEEN
Bilirubin Urine: NEGATIVE
Glucose, UA: NEGATIVE mg/dL
Hgb urine dipstick: NEGATIVE
Ketones, ur: NEGATIVE mg/dL
Nitrite: NEGATIVE
Protein, ur: NEGATIVE mg/dL
Specific Gravity, Urine: 1.024 (ref 1.005–1.030)
pH: 5 (ref 5.0–8.0)

## 2020-04-05 LAB — CBC
HCT: 37.8 % (ref 36.0–46.0)
Hemoglobin: 12.3 g/dL (ref 12.0–15.0)
MCH: 29 pg (ref 26.0–34.0)
MCHC: 32.5 g/dL (ref 30.0–36.0)
MCV: 89.2 fL (ref 80.0–100.0)
Platelets: 232 10*3/uL (ref 150–400)
RBC: 4.24 MIL/uL (ref 3.87–5.11)
RDW: 11.8 % (ref 11.5–15.5)
WBC: 7.8 10*3/uL (ref 4.0–10.5)
nRBC: 0 % (ref 0.0–0.2)

## 2020-04-05 LAB — COMPREHENSIVE METABOLIC PANEL
ALT: 14 U/L (ref 0–44)
AST: 28 U/L (ref 15–41)
Albumin: 4.5 g/dL (ref 3.5–5.0)
Alkaline Phosphatase: 39 U/L (ref 38–126)
Anion gap: 9 (ref 5–15)
BUN: 10 mg/dL (ref 6–20)
CO2: 24 mmol/L (ref 22–32)
Calcium: 9.4 mg/dL (ref 8.9–10.3)
Chloride: 107 mmol/L (ref 98–111)
Creatinine, Ser: 0.72 mg/dL (ref 0.44–1.00)
GFR calc Af Amer: >60 mL/min (ref >60)
GFR calc non Af Amer: >60 mL/min (ref >60)
Glucose, Bld: 86 mg/dL (ref 70–99)
Potassium: 3.7 mmol/L (ref 3.5–5.1)
Sodium: 140 mmol/L (ref 135–145)
Total Bilirubin: 0.8 mg/dL (ref 0.3–1.2)
Total Protein: 8.3 g/dL — ABNORMAL HIGH (ref 6.5–8.1)

## 2020-04-05 LAB — POCT PREGNANCY, URINE: Preg Test, Ur: NEGATIVE

## 2020-04-05 LAB — LIPASE, BLOOD: Lipase: 36 U/L (ref 11–51)

## 2020-04-05 NOTE — ED Triage Notes (Signed)
Pt to the er for abd pain x 1 week. Pt states her abd has become more distended. Pt states now she has pain in her flanks and ribs and hurts when she coughs. Pt taking IBU at home for pain.

## 2020-04-06 ENCOUNTER — Emergency Department
Admission: EM | Admit: 2020-04-06 | Discharge: 2020-04-06 | Disposition: A | Discharge status: Home / Self Care | Payer: Medicaid Other | Attending: Student | Admitting: Student

## 2020-04-06 ENCOUNTER — Encounter: Payer: Self-pay | Admitting: Radiology

## 2020-04-06 ENCOUNTER — Emergency Department: Payer: Medicaid Other

## 2020-04-06 DIAGNOSIS — R109 Unspecified abdominal pain: Secondary | ICD-10-CM

## 2020-04-06 DIAGNOSIS — I871 Compression of vein: Secondary | ICD-10-CM

## 2020-04-06 MED ORDER — ONDANSETRON HCL 4 MG PO TABS: 4.0000 mg | ORAL_TABLET | Freq: Three times a day (TID) | ORAL | 0 refills | Status: AC | PRN

## 2020-04-06 MED ORDER — OXYCODONE HCL 5 MG PO TABS: 5.0000 mg | ORAL_TABLET | Freq: Four times a day (QID) | ORAL | 0 refills | Status: AC | PRN

## 2020-04-06 MED ORDER — ONDANSETRON 4 MG PO TBDP
4.0000 mg | ORAL_TABLET | Freq: Once | ORAL | Status: AC
  Administered 2020-04-06: 06:00:00 4 mg via ORAL
  Filled 2020-04-06: qty 1

## 2020-04-06 MED ORDER — SENNOSIDES-DOCUSATE SODIUM 8.6-50 MG PO TABS: 1.0000 | ORAL_TABLET | Freq: Two times a day (BID) | ORAL | 0 refills | Status: AC

## 2020-04-06 MED ORDER — IOHEXOL 300 MG/ML  SOLN
100.0000 mL | Freq: Once | INTRAMUSCULAR | Status: AC | PRN
  Administered 2020-04-06: 100 mL via INTRAVENOUS

## 2020-04-06 NOTE — ED Notes (Signed)
Pt c/o nausea at this time. MD notified

## 2020-04-06 NOTE — Discharge Instructions (Signed)
Thank you for letting us take care of you in the emergency department today. 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   Please call to set up an outpatient appointment with the Vascular/Vein doctors at [facility]. One of the names of the doctors is listed below. The clinic number is [phone].  Please return to the ER for any new or worsening symptoms.

## 2020-04-06 NOTE — ED Provider Notes (Signed)
[facility] Emergency Department Provider Note  ____________________________________________   First MD Initiated Contact with Patient 04/06/20 [phone]     (approximate)  I have reviewed the triage vital signs and the nursing notes.  History  Chief Complaint Abdominal Pain    HPI Brianna Ellis is a 19 y.o. female with no significant medical history who presents emergency department for abdominal pain.  Symptoms have been present for 1 week and progressively worsening.  She states at the onset of her symptoms ~ 1 week ago she had some vague, mild upper abdominal and stomach discomfort.  Over the week it has progressed to include lower abdominal pain and bilateral flank area, which radiates somewhat to the back.  She also reports abdominal fullness and bloating, which is atypical for her.  Some nausea, no vomiting.  No diarrhea.  Had a normal BM earlier today.  No fevers or chills.  No dysuria or vaginal discharge.  LMP earlier this month, but states it was lighter than normal.  No intra-abdominal surgical history. Tried ibuprofen at home for pain without significant relief.   Past Medical Hx History reviewed. No pertinent past medical history.  Problem List Patient Active Problem List   Diagnosis Date Noted  . Encounter for counseling regarding contraception 01/19/2020    Past Surgical Hx Past Surgical History:  Procedure Laterality Date  . NO PAST SURGERIES      Medications Prior to Admission medications   Medication Sig Start Date End Date Taking? Authorizing Provider  ibuprofen (ADVIL,MOTRIN) 800 MG tablet Take 1 tablet (800 mg total) by mouth every 8 (eight) hours as needed. 06/11/15   Beers, Pierce Crane, PA-C  Norethindrone Acetate-Ethinyl Estrad-FE (LOESTRIN 24 FE) 1-20 MG-MCG(24) tablet Take 1 tablet by mouth daily. 01/19/20   Mar Daring, PA-C    Allergies Patient has no known allergies.  Family Hx Family History  Problem Relation  Age of Onset  . Diabetes Mother   . Hypertension Maternal Grandfather     Social Hx Social History   Tobacco Use  . Smoking status: Never Smoker  . Smokeless tobacco: Never Used  Substance Use Topics  . Alcohol use: Yes    Comment: occasionaly  . Drug use: Yes    Types: Marijuana     Review of Systems  Constitutional: Negative for fever. Negative for chills. Eyes: Negative for visual changes. ENT: Negative for sore throat. Cardiovascular: Negative for chest pain. Respiratory: Negative for shortness of breath. Gastrointestinal: Positive for abdominal pain. Genitourinary: Negative for dysuria. Musculoskeletal: Negative for leg swelling. Skin: Negative for rash. Neurological: Negative for headaches.   Physical Exam  Vital Signs: ED Triage Vitals  Enc Vitals Group     BP 04/05/20 2316 124/73     Pulse Rate 04/05/20 2316 93     Resp 04/05/20 2316 18     Temp 04/05/20 2316 98.3 F (36.8 C)     Temp Source 04/05/20 2316 Oral     SpO2 04/05/20 2316 100 %     Weight 04/05/20 2317 145 lb (65.8 kg)     Height 04/05/20 2317 5\' 8"  (1.727 m)     Head Circumference --      Peak Flow --      Pain Score 04/05/20 2316 7     Pain Loc --      Pain Edu? --      Excl. in Max? --     Constitutional: Alert and oriented. Well appearing. NAD.  Head:  Normocephalic. Atraumatic. Eyes: Conjunctivae clear. Sclera anicteric. Pupils equal and symmetric. Nose: No masses or lesions. No congestion or rhinorrhea. Mouth/Throat: Wearing mask.  Neck: No stridor. Trachea midline.  Cardiovascular: Normal rate, regular rhythm. Extremities well perfused. Respiratory: Normal respiratory effort.  Lungs CTAB. Gastrointestinal: Soft.  Mildly distended vs gaseous.  Mild discomfort to palpation throughout.  No focal localized tenderness.  No rebound, guarding, or rigidity. Genitourinary: Deferred. Musculoskeletal: No lower extremity edema. No deformities. Neurologic:  Normal speech and language. No  gross focal or lateralizing neurologic deficits are appreciated.  Skin: Skin is warm, dry and intact. No rash noted. Psychiatric: Mood and affect are appropriate for situation.  EKG  N/A   Radiology  Personally reviewed available imaging myself.   CT A/P IMPRESSION:  1. Abrupt focal narrowing of the left renal vein as it passes  posterior to the acutely angled SMA suspicious for a nutcracker  syndrome. Dilated tortuous appearance of the distal renal vein  beyond the level of obstruction as well as marked distension of the  left gonadal vein and parametrial collaterals in the pelvis  concerning for a pelvic congestion syndrome and/or possible venous  shunting across the pelvis.  2. Fecalization of the distal small bowel contents without evidence  of mechanical obstruction. Such findings can be seen with slowed  intestinal transit/constipation.    Procedures  Procedure(s) performed (including critical care):  Procedures   Initial Impression / Assessment and Plan / MDM / ED Course  19 y.o. female who presents to the ED for abdominal pain, bloating, distention  Ddx: constipation, obstruction, mass lesion, gas  Will plan for labs, urine studies, and imaging  Labs and UA unremarkable. CT imaging as above, concerning for potential nutcracker syndrome, discussed with radiology.  Updated patient on results.  Discussed case with vascular, Dr. Teola Bradley who will arrange for follow-up as an outpatient.  If patient continues to be symptomatic, could consider stenting of the left renal vein, however no emergent indication for intervention at present.  At this time, as she is otherwise stable she is appropriate for discharge with outpatient follow-up.  Provided Rx for symptomatic pain and nausea control and given return precautions.  Also provided prescription for Senokot given other findings on CT suggestive of slowed intestinal transit/constipation, as this may also be contributing to her  symptoms.  Patient voices understanding and is comfortable with the plan and discharge.   _______________________________   As part of my medical decision making I have reviewed available labs, radiology tests, reviewed old records/chart review  and discussed with consultants (radiology, vascular).     Final Clinical Impression(s) / ED Diagnosis  Final diagnoses:  Abdominal discomfort  Nutcracker phenomenon of renal vein       Note:  This document was prepared using Dragon voice recognition software and may include unintentional dictation errors.   Lilia Pro., MD 04/06/20 [phone]

## 2020-04-18 ENCOUNTER — Ambulatory Visit (INDEPENDENT_AMBULATORY_CARE_PROVIDER_SITE_OTHER): Payer: Medicaid Other | Admitting: Vascular Surgery

## 2020-04-22 ENCOUNTER — Encounter (INDEPENDENT_AMBULATORY_CARE_PROVIDER_SITE_OTHER): Payer: Self-pay | Admitting: Vascular Surgery

## 2020-04-22 ENCOUNTER — Ambulatory Visit (INDEPENDENT_AMBULATORY_CARE_PROVIDER_SITE_OTHER): Payer: Medicaid Other | Admitting: Vascular Surgery

## 2020-04-22 ENCOUNTER — Other Ambulatory Visit: Payer: Self-pay

## 2020-04-22 DIAGNOSIS — R1084 Generalized abdominal pain: Secondary | ICD-10-CM | POA: Diagnosis not present

## 2020-04-22 DIAGNOSIS — I871 Compression of vein: Secondary | ICD-10-CM | POA: Diagnosis not present

## 2020-04-23 ENCOUNTER — Encounter (INDEPENDENT_AMBULATORY_CARE_PROVIDER_SITE_OTHER): Payer: Self-pay | Admitting: Vascular Surgery

## 2020-04-23 DIAGNOSIS — R109 Unspecified abdominal pain: Secondary | ICD-10-CM | POA: Insufficient documentation

## 2020-04-23 DIAGNOSIS — I871 Compression of vein: Secondary | ICD-10-CM | POA: Insufficient documentation

## 2020-04-23 NOTE — Progress Notes (Signed)
MRN : [id number]  Brianna Ellis is a 19 y.o. (2001-04-08) female who presents with chief complaint of  Chief Complaint  Patient presents with   New Patient (Initial Visit)    [facility] ED consult nutcracker of renal vein  .  History of Present Illness:   As part of a work-up investigating a unspecified more generalized abdominal discomfort a CT scan of the abdomen and pelvis was ordered.  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  Location: Diffuse abdominal/epigastric Character/quality of the symptom: A dull ache/crampy discomfort Severity: Moderately severe Duration: Different lengths of time but seems to come and go Timing/onset: No particular onset or timing Aggravating/context: Nothing seems to make it worse Relieving/modifying: Patient has not found anything that relieves the discomfort  Current Meds  Medication Sig   ibuprofen (ADVIL,MOTRIN) 800 MG tablet Take 1 tablet (800 mg total) by mouth every 8 (eight) hours as needed.   Norethindrone Acetate-Ethinyl Estrad-FE (LOESTRIN 24 FE) 1-20 MG-MCG(24) tablet Take 1 tablet by mouth daily.   ondansetron (ZOFRAN) 4 MG tablet Take 4 mg by mouth every 8 (eight) hours as needed for nausea or vomiting.   oxyCODONE-acetaminophen (PERCOCET) 7.5-325 MG tablet SMARTSIG:0.5 Tablet(s) By Mouth Every 4 Hours PRN   senna-docusate (STIMULANT LAXATIVE) 8.6-50 MG tablet Take 1 tablet by mouth as needed for mild constipation.    No past medical history on file.  Past Surgical History:  Procedure Laterality Date   NO PAST SURGERIES      Social History Social History   Tobacco Use   Smoking status: Never Smoker   Smokeless tobacco: Never Used  Substance Use Topics   Alcohol use: Yes    Comment: occasionaly   Drug use: Yes    Types: Marijuana    Family History Family History  Problem Relation Age of Onset   Diabetes  Mother    Hypertension Maternal Grandfather   No family history of bleeding/clotting disorders, porphyria or autoimmune disease   No Known Allergies   REVIEW OF SYSTEMS (Negative unless checked)  Constitutional: [] Weight loss  [] Fever  [] Chills Cardiac: [] Chest pain   [] Chest pressure   [] Palpitations   [] Shortness of breath when laying flat   [] Shortness of breath with exertion. Vascular:  [] Pain in legs with walking   [] Pain in legs at rest  [] History of DVT   [] Phlebitis   [] Swelling in legs   [] Varicose veins   [] Non-healing ulcers Pulmonary:   [] Uses home oxygen   [] Productive cough   [] Hemoptysis   [] Wheeze  [] COPD   [] Asthma Neurologic:  [] Dizziness   [] Seizures   [] History of stroke   [] History of TIA  [] Aphasia   [] Vissual changes   [] Weakness or numbness in arm   [] Weakness or numbness in leg Musculoskeletal:   [] Joint swelling   [] Joint pain   [] Low back pain Hematologic:  [] Easy bruising  [] Easy bleeding   [] Hypercoagulable state   [] Anemic Gastrointestinal:  [] Diarrhea   [] Vomiting  [] Gastroesophageal reflux/heartburn   [] Difficulty swallowing. Genitourinary:  [] Chronic kidney disease   [] Difficult urination  [] Frequent urination   [] Blood in urine Skin:  [] Rashes   [] Ulcers  Psychological:  [] History of anxiety   []  History of major depression.  Physical Examination  Vitals:   04/22/20 1040  BP: 113/69  Pulse: 85  Resp: 16  Weight: 150 lb 9.6 oz (68.3 kg)  Height: 5\' 8"  (1.727 m)  Body mass index is 22.9 kg/m. Gen: WD/WN, NAD Head: [place]/AT, No temporalis wasting.  Ear/Nose/Throat: Hearing grossly intact, nares w/o erythema or drainage, poor dentition Eyes: PER, EOMI, sclera nonicteric.  Neck: Supple, no masses.  No bruit or JVD.  Pulmonary:  Good air movement, clear to auscultation bilaterally, no use of accessory muscles.  Cardiac: RRR, normal S1, S2, no Murmurs. Vascular:  Vessel Right Left  Radial Palpable Palpable  Gastrointestinal: soft, non-distended.  No guarding/no peritoneal signs.  Musculoskeletal: M/S 5/5 throughout.  No deformity or atrophy.  Neurologic: CN 2-12 intact. Pain and light touch intact in extremities.  Symmetrical.  Speech is fluent. Motor exam as listed above. Psychiatric: Judgment intact, Mood & affect appropriate for pt's clinical situation. Dermatologic: No rashes or ulcers noted.  No changes consistent with cellulitis.   CBC Lab Results  Component Value Date   WBC 7.8 04/05/2020   HGB 12.3 04/05/2020   HCT 37.8 04/05/2020   MCV 89.2 04/05/2020   PLT 232 04/05/2020    BMET    Component Value Date/Time   NA 140 04/05/2020 2321   K 3.7 04/05/2020 2321   CL 107 04/05/2020 2321   CO2 24 04/05/2020 2321   GLUCOSE 86 04/05/2020 2321   BUN 10 04/05/2020 2321   CREATININE 0.72 04/05/2020 2321   CALCIUM 9.4 04/05/2020 2321   GFRNONAA >60 04/05/2020 2321   GFRAA >60 04/05/2020 2321   Estimated Creatinine Clearance: 113.2 mL/min (by C-G formula based on SCr of 0.72 mg/dL).  COAG No results found for: INR, PROTIME  Radiology CT Abdomen Pelvis W Contrast  Result Date: 04/06/2020 CLINICAL DATA:  Abdominal pain, swelling and distension EXAM: CT ABDOMEN AND PELVIS WITH CONTRAST TECHNIQUE: Multidetector CT imaging of the abdomen and pelvis was performed using the standard protocol following bolus administration of intravenous contrast. CONTRAST:  181mL OMNIPAQUE IOHEXOL 300 MG/ML  SOLN COMPARISON:  None FINDINGS: Lower chest: Lung bases are clear. Normal heart size. No pericardial effusion. Hepatobiliary: No focal liver abnormality is seen. No gallstones, gallbladder wall thickening, or biliary dilatation. Pancreas: Unremarkable. No pancreatic ductal dilatation or surrounding inflammatory changes. Spleen: Normal in size without focal abnormality. Adrenals/Urinary Tract: Adrenal glands are unremarkable. Kidneys are normal, without renal calculi, focal lesion, or hydronephrosis. Bladder is unremarkable. Stomach/Bowel:  Evaluation of the bowel is somewhat complicated by a paucity of intraperitoneal fat. Distal esophagus, stomach and duodenal sweep are unremarkable. No small bowel wall thickening or dilatation. No evidence of obstruction. Fecalization of the distal small bowel contents without evidence of mechanical obstruction. Normal appearing appendix is seen in a retrocecal appendix draped across the right iliac vessels. No colonic dilatation or wall thickening. Vascular/Lymphatic: The aorta is normal caliber. There is focal narrowing as the left renal vein passes posterior to the SMA which demonstrates a markedly acute angle with the aorta (sagittal 6/60, axial 2/18). The distal left renal vein demonstrates a slightly irregular patulous appearance. Additionally, there is notable prominence of the gonadal vessels left greater than right worrisome for pelvic congestion. No suspicious or enlarged lymph nodes in the included lymphatic chains. Reproductive: Prominent gonadal vessels as above with dilated collaterals of the myometrium. Grossly normal follicle seen in both ovaries. Ovarian size is symmetric. Small amount of air and debris within the vaginal vault, nonspecific. Other: No abdominopelvic free fluid or air. No bowel containing hernia. Musculoskeletal: No acute bony abnormality. Specifically, no fracture, subluxation, or dislocation. IMPRESSION: 1. Abrupt focal narrowing of the left renal vein as it passes posterior to the  acutely angled SMA suspicious for a nutcracker syndrome. Dilated tortuous appearance of the distal renal vein beyond the level of obstruction as well as marked distension of the left gonadal vein and parametrial collaterals in the pelvis concerning for a pelvic congestion syndrome and/or possible venous shunting across the pelvis. 2. Fecalization of the distal small bowel contents without evidence of mechanical obstruction. Such findings can be seen with slowed intestinal transit/constipation. These  results were called by telephone at the time of interpretation on 04/06/2020 at 4:01 am to provider [facility] , who verbally acknowledged these results. Electronically Signed   By: Lovena Le M.D.   On: 04/06/2020 04:03     Assessment/Plan 1. Nutcracker phenomenon of renal vein I have personally reviewed the CT scan as well as shown it to Ms. Gerilyn Nestle.  We have discussed the anatomic considerations and the findings on CT.  In addition to the stricture of the left renal vein as well as the enlargement of the gonadal vessels there also appears to be a moderate degree of may Thurner involving the left iliac vein.  There seemed to be significant increase in veins within the left flank and left pelvis.  This certainly could be contributing to her nonspecific abdominal complaints.  Given these findings I have discussed with her referral to [facility] for further work-up of her left renal vein stricture and pelvic congestion.  I have contacted Dr. Adriana Mccallum and he has offered to see the patient.  I have also requested the patient go to the hospital and obtain a copy of her CT scan to bring to [facility].  Patient voices understanding and agreement with this plan.  2. Generalized abdominal pain See above   Hortencia Pilar, MD  04/23/2020 1:01 PM

## 2020-05-21 ENCOUNTER — Telehealth: Payer: Self-pay | Admitting: Physician Assistant

## 2020-05-21 ENCOUNTER — Ambulatory Visit: Payer: Self-pay | Admitting: *Deleted

## 2020-05-21 NOTE — Telephone Encounter (Signed)
Erroneous encounter

## 2020-05-21 NOTE — Telephone Encounter (Signed)
Message from Gearldine Shown sent at 05/21/2020 1:28 PM EDT  Pt is experiencing an earache. Pt states that pus is coming out of her ear. Please call her back at [phone]  Returned call to patient . Patient has c/o of right  ear pain x 3 days. Pain rated as 3 now but increases at night. Drainage from right ear clear, "watery" and noted some drainage starting in left ear. Patient reports she has received Wynetta Emery and [employer] covid vaccine approx. 03/27/2020. Patient reported she had recently had wisdom teeth extracted approx. A couple of months ago. Unable to give exact date. Patient reported jaw felt like it "clinched" or "locking" at times. On initial onset of ear pain patient reports noting neck swelling and pain with swallowing. patient scheduled  Virtual appt. On 6/.2/.2021. Home care advise given and patient verbalized understanding.  Reason for Disposition . Earache  (Exceptions: brief ear pain of < 60 minutes duration, earache occurring during air travel  Answer Assessment - Initial Assessment Questions 1. LOCATION: "Which ear is involved?"     Right  2. ONSET: "When did the ear start hurting"      3 days ago 3. SEVERITY: "How bad is the pain?"  (Scale 1-10; mild, moderate or severe)   - MILD (1-3): doesn't interfere with normal activities    - MODERATE (4-7): interferes with normal activities or awakens from sleep    - SEVERE (8-10): excruciating pain, unable to do any normal activities      3 mild, severe at night and in morning due to sleeping on it, and loud noise 4. URI SYMPTOMS: " Do you have a runny nose or cough?"     No  5. FEVER: "Do you have a fever?" If so, ask: "What is your temperature, how was it measured, and when did it start?"     No . Right side of face warm 6. CAUSE: "Have you been swimming recently?", "How often do you use Q-TIPS?", "Have you had any recent air travel or scuba diving?"     No  7. OTHER SYMPTOMS: "Do you have any other symptoms?" (e.g., headache,  stiff neck, dizziness, vomiting, runny nose, decreased hearing)     Ear ached and soreness with swallowing on right side. 8. PREGNANCY: "Is there any chance you are pregnant?" "When was your last menstrual period?"     No.  last month  Protocols used: EARACHE-A-AH

## 2020-05-22 ENCOUNTER — Encounter: Payer: Self-pay | Admitting: Physician Assistant

## 2020-05-22 ENCOUNTER — Telehealth (INDEPENDENT_AMBULATORY_CARE_PROVIDER_SITE_OTHER): Payer: Medicaid Other | Admitting: Physician Assistant

## 2020-05-22 DIAGNOSIS — Z3009 Encounter for other general counseling and advice on contraception: Secondary | ICD-10-CM

## 2020-05-22 DIAGNOSIS — H66001 Acute suppurative otitis media without spontaneous rupture of ear drum, right ear: Secondary | ICD-10-CM

## 2020-05-22 MED ORDER — NEOMYCIN-POLYMYXIN-HC 3.5-10000-1 OT SOLN: 3.0000 [drp] | Freq: Three times a day (TID) | OTIC | 0 refills | Status: DC

## 2020-05-22 MED ORDER — NORETHIN ACE-ETH ESTRAD-FE 1-20 MG-MCG(24) PO TABS: 1.0000 | ORAL_TABLET | Freq: Every day | ORAL | 4 refills | Status: DC

## 2020-05-22 NOTE — Progress Notes (Signed)
MyChart Video Visit    Virtual Visit via Video Note   This visit type was conducted due to national recommendations for restrictions regarding the COVID-19 Pandemic (e.g. social distancing) in an effort to limit this patient's exposure and mitigate transmission in our community. This patient is at least at moderate risk for complications without adequate follow up. This format is felt to be most appropriate for this patient at this time. Physical exam was limited by quality of the video and audio technology used for the visit.   Patient location: Home Provider location: [facility]    Patient: Brianna Ellis   DOB: 2001/11/12   19 y.o. Female  MRN: [id number] Visit Date: 05/22/2020  Today's healthcare provider: Mar Daring, PA-C   Chief Complaint  Patient presents with  . Ear Drainage   Subjective    Ear Drainage  There is pain in the right ear. This is a new problem. The current episode started in the past 7 days (4 days ago). The problem occurs every few hours. The problem has been unchanged. There has been no fever. The pain is mild. Associated symptoms include ear discharge ('little") and a sore throat (Right side on day 1). Pertinent negatives include no coughing, headaches or rhinorrhea. She has tried NSAIDs (tea tree oil) for the symptoms. The treatment provided no relief.   Patient refill on her birth control  Patient Active Problem List   Diagnosis Date Noted  . 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 Abdominal pain 04/23/2020  . Encounter for counseling regarding contraception 01/19/2020   No past medical history on file.    Medications: Outpatient Medications Prior to Visit  Medication Sig  . ibuprofen (ADVIL,MOTRIN) 800 MG tablet Take 1 tablet (800 mg total) by mouth every 8 (eight) hours as needed.  . Norethindrone Acetate-Ethinyl Estrad-FE (LOESTRIN 24 FE) 1-20 MG-MCG(24) tablet Take 1 tablet by mouth daily.  . ondansetron (ZOFRAN) 4 MG tablet  Take 4 mg by mouth every 8 (eight) hours as needed for nausea or vomiting.  Marland Kitchen oxyCODONE-acetaminophen (PERCOCET) 7.5-325 MG tablet SMARTSIG:0.5 Tablet(s) By Mouth Every 4 Hours PRN  . senna-docusate (STIMULANT LAXATIVE) 8.6-50 MG tablet Take 1 tablet by mouth as needed for mild constipation.   No facility-administered medications prior to visit.    Review of Systems  Constitutional: Negative for fatigue and fever.  HENT: Positive for ear discharge ('little"), ear pain and sore throat (Right side on day 1). Negative for congestion, postnasal drip and rhinorrhea.   Respiratory: Negative for cough, chest tightness and shortness of breath.   Cardiovascular: Negative.   Neurological: Negative for headaches.    Last CBC Lab Results  Component Value Date   WBC 7.8 04/05/2020   HGB 12.3 04/05/2020   HCT 37.8 04/05/2020   MCV 89.2 04/05/2020   MCH 29.0 04/05/2020   RDW 11.8 04/05/2020   PLT 232 [id number]   Last metabolic panel Lab Results  Component Value Date   GLUCOSE 86 04/05/2020   NA 140 04/05/2020   K 3.7 04/05/2020   CL 107 04/05/2020   CO2 24 04/05/2020   BUN 10 04/05/2020   CREATININE 0.72 04/05/2020   GFRNONAA >60 04/05/2020   GFRAA >60 04/05/2020   CALCIUM 9.4 04/05/2020   PROT 8.3 (H) 04/05/2020   ALBUMIN 4.5 04/05/2020   BILITOT 0.8 04/05/2020   ALKPHOS 39 04/05/2020   AST 28 04/05/2020   ALT 14 04/05/2020   ANIONGAP 9 04/05/2020  Objective    There were no vitals taken for this visit. BP Readings from Last 3 Encounters:  04/22/20 113/69  04/06/20 119/73  01/19/20 100/62   Wt Readings from Last 3 Encounters:  04/22/20 150 lb 9.6 oz (68.3 kg)  04/05/20 145 lb (65.8 kg)  01/19/20 150 lb (68 kg)      Physical Exam Vitals reviewed.  Constitutional:      General: She is not in acute distress.    Appearance: Normal appearance. She is well-developed. She is not ill-appearing.  HENT:     Head: Normocephalic and atraumatic.  Pulmonary:      Effort: Pulmonary effort is normal. No respiratory distress.  Musculoskeletal:     Cervical back: Normal range of motion and neck supple.  Neurological:     Mental Status: She is alert.  Psychiatric:        Mood and Affect: Mood normal.        Behavior: Behavior normal.        Thought Content: Thought content normal.        Judgment: Judgment normal.        Assessment & Plan     1. Non-recurrent acute suppurative otitis media of right ear without spontaneous rupture of tympanic membrane Suspect otitis media. Improving some today. Will give cortipsorin drops to start x 3-5 days. Call if not improving.  - neomycin-polymyxin-hydrocortisone (CORTISPORIN) OTIC solution; Place 3 drops into the right ear 3 (three) times daily. X 3-5 days  Dispense: 10 mL; Refill: 0  2. Encounter for counseling regarding contraception Stable. Diagnosis pulled for medication refill. Continue current medical treatment plan. - Norethindrone Acetate-Ethinyl Estrad-FE (LOESTRIN 24 FE) 1-20 MG-MCG(24) tablet; Take 1 tablet by mouth daily.  Dispense: 3 Package; Refill: 4   No follow-ups on file.     I discussed the assessment and treatment plan with the patient. The patient was provided an opportunity to ask questions and all were answered. The patient agreed with the plan and demonstrated an understanding of the instructions.   The patient was advised to call back or seek an in-person evaluation if the symptoms worsen or if the condition fails to improve as anticipated.  I provided 12 minutes of non-face-to-face time during this encounter.  Reynolds Bowl, PA-C, have reviewed all documentation for this visit. The documentation on 05/22/20 for the exam, diagnosis, procedures, and orders are all accurate and complete.  Rubye Beach [facility] [phone] (phone) [phone] (fax)  [place]

## 2020-05-22 NOTE — Patient Instructions (Signed)
Otitis Media, Adult  Otitis media means that the middle ear is red and swollen (inflamed) and full of fluid. The condition usually goes away on its own. Follow these instructions at home:  Take over-the-counter and prescription medicines only as told by your doctor.  If you were prescribed an antibiotic medicine, take it as told by your doctor. Do not stop taking the antibiotic even if you start to feel better.  Keep all follow-up visits as told by your doctor. This is important. Contact a doctor if:  You have bleeding from your nose.  There is a lump on your neck.  You are not getting better in 5 days.  You feel worse instead of better. Get help right away if:  You have pain that is not helped with medicine.  You have swelling, redness, or pain around your ear.  You get a stiff neck.  You cannot move part of your face (paralyzed).  You notice that the bone behind your ear hurts when you touch it.  You get a very bad headache. Summary  Otitis media means that the middle ear is red, swollen, and full of fluid.  This condition usually goes away on its own. In some cases, treatment may be needed.  If you were prescribed an antibiotic medicine, take it as told by your doctor. This information is not intended to replace advice given to you by your health care provider. Make sure you discuss any questions you have with your health care provider. Document Revised: 11/19/2017 Document Reviewed: 12/28/2016 Elsevier Patient Education  2020 Elsevier Inc.  

## 2020-05-24 ENCOUNTER — Telehealth: Payer: Self-pay | Admitting: Physician Assistant

## 2020-05-24 NOTE — Telephone Encounter (Signed)
Work note sent via mychart. ?

## 2020-05-24 NOTE — Telephone Encounter (Signed)
Copied from [place] [id number]. Topic: General - Inquiry >> May 24, 2020  8:14 AM Scherrie Gerlach wrote: Reason for CRM: pt states she is not feeling well today. Was seen (virtual) yesterday for an ear infection, and states she is really feeling "under the weather" today.  Pt has started her abx, and feels like it is working.  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  Pt would like a dr note from [place] to be out of work today, 05/24/2020. Pt states OK to put on mychart. Please call with any questions.

## 2020-06-13 DIAGNOSIS — R0789 Other chest pain: Secondary | ICD-10-CM | POA: Diagnosis not present

## 2020-06-13 DIAGNOSIS — R457 State of emotional shock and stress, unspecified: Secondary | ICD-10-CM | POA: Diagnosis not present

## 2020-06-13 DIAGNOSIS — R079 Chest pain, unspecified: Secondary | ICD-10-CM | POA: Diagnosis not present

## 2020-06-13 DIAGNOSIS — R000 Tachycardia, unspecified: Secondary | ICD-10-CM | POA: Diagnosis not present

## 2020-06-13 DIAGNOSIS — R064 Hyperventilation: Secondary | ICD-10-CM | POA: Diagnosis not present

## 2020-09-03 NOTE — Progress Notes (Deleted)
     Established patient visit   Patient: Brianna Ellis   DOB: 14-Mar-2001   19 y.o. Female  MRN: [id number] Visit Date: 09/04/2020  Today's healthcare provider: Mar Daring, PA-C   No chief complaint on file.  Subjective    HPI  ***  {Show patient history (optional):23778::" "}   Medications: Outpatient Medications Prior to Visit  Medication Sig  . ibuprofen (ADVIL,MOTRIN) 800 MG tablet Take 1 tablet (800 mg total) by mouth every 8 (eight) hours as needed.  . neomycin-polymyxin-hydrocortisone (CORTISPORIN) OTIC solution Place 3 drops into the right ear 3 (three) times daily. X 3-5 days  . Norethindrone Acetate-Ethinyl Estrad-FE (LOESTRIN 24 FE) 1-20 MG-MCG(24) tablet Take 1 tablet by mouth daily.  . ondansetron (ZOFRAN) 4 MG tablet Take 4 mg by mouth every 8 (eight) hours as needed for nausea or vomiting.   No facility-administered medications prior to visit.    Review of Systems  {Heme  Chem  Endocrine  Serology  Results Review (optional):23779::" "}  Objective    There were no vitals taken for this visit. {Show previous vital signs (optional):23777::" "}  Physical Exam  ***  No results found for any visits on 09/04/20.  Assessment & Plan     ***  No follow-ups on file.      {provider attestation***:1}   Rubye Beach  [facility] [phone] (phone) [phone] (fax)  [place]

## 2020-09-04 ENCOUNTER — Ambulatory Visit: Payer: Medicaid Other | Admitting: Physician Assistant

## 2020-09-09 ENCOUNTER — Encounter: Payer: Self-pay | Admitting: Physician Assistant

## 2020-09-09 ENCOUNTER — Telehealth (INDEPENDENT_AMBULATORY_CARE_PROVIDER_SITE_OTHER): Payer: Medicaid Other | Admitting: Physician Assistant

## 2020-09-09 DIAGNOSIS — N760 Acute vaginitis: Secondary | ICD-10-CM

## 2020-09-09 DIAGNOSIS — B9689 Other specified bacterial agents as the cause of diseases classified elsewhere: Secondary | ICD-10-CM | POA: Diagnosis not present

## 2020-09-09 DIAGNOSIS — N946 Dysmenorrhea, unspecified: Secondary | ICD-10-CM | POA: Diagnosis not present

## 2020-09-09 MED ORDER — METRONIDAZOLE 500 MG PO TABS: 500.0000 mg | ORAL_TABLET | Freq: Two times a day (BID) | ORAL | 0 refills | Status: DC

## 2020-09-09 NOTE — Progress Notes (Signed)
MyChart Video Visit    Virtual Visit via Video Note   This visit type was conducted due to national recommendations for restrictions regarding the COVID-19 Pandemic (e.g. social distancing) in an effort to limit this patient's exposure and mitigate transmission in our community. This patient is at least at moderate risk for complications without adequate follow up. This format is felt to be most appropriate for this patient at this time. Physical exam was limited by quality of the video and audio technology used for the visit.   Patient location: Home Provider location: [facility]  I discussed the limitations of evaluation and management by telemedicine and the availability of in person appointments. The patient expressed understanding and agreed to proceed.  Patient: Brianna Ellis   DOB: February 12, 2001   19 y.o. Female  MRN: [id number] Visit Date: 09/09/2020  Today's healthcare provider: Mar Daring, PA-C   Chief Complaint  Patient presents with  . Menstrual Problem   Subjective    HPI  Patient with c/o menstrual cramping, more intense today and because she call the school they told her that she needed a doctors note.  Patient is also having thick, white vaginal discharge that has a foul, fishy odor. She denies any vaginal itching or burning. No genital lesions.   Patient Active Problem List   Diagnosis Date Noted  . 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 Abdominal pain 04/23/2020  . Encounter for counseling regarding contraception 01/19/2020   No past medical history on file.    Medications: Outpatient Medications Prior to Visit  Medication Sig  . ibuprofen (ADVIL,MOTRIN) 800 MG tablet Take 1 tablet (800 mg total) by mouth every 8 (eight) hours as needed.  . neomycin-polymyxin-hydrocortisone (CORTISPORIN) OTIC solution Place 3 drops into the right ear 3 (three) times daily. X 3-5 days  . Norethindrone Acetate-Ethinyl Estrad-FE (LOESTRIN 24 FE) 1-20  MG-MCG(24) tablet Take 1 tablet by mouth daily.  . ondansetron (ZOFRAN) 4 MG tablet Take 4 mg by mouth every 8 (eight) hours as needed for nausea or vomiting.   No facility-administered medications prior to visit.    Review of Systems  Constitutional: Negative.   Respiratory: Negative.   Cardiovascular: Negative.   Gastrointestinal: Positive for abdominal distention and abdominal pain.  Genitourinary: Positive for pelvic pain and vaginal discharge.    Last CBC Lab Results  Component Value Date   WBC 7.8 04/05/2020   HGB 12.3 04/05/2020   HCT 37.8 04/05/2020   MCV 89.2 04/05/2020   MCH 29.0 04/05/2020   RDW 11.8 04/05/2020   PLT 232 78/58/8502   Last metabolic panel Lab Results  Component Value Date   GLUCOSE 86 04/05/2020   NA 140 04/05/2020   K 3.7 04/05/2020   CL 107 04/05/2020   CO2 24 04/05/2020   BUN 10 04/05/2020   CREATININE 0.72 04/05/2020   GFRNONAA >60 04/05/2020   GFRAA >60 04/05/2020   CALCIUM 9.4 04/05/2020   PROT 8.3 (H) 04/05/2020   ALBUMIN 4.5 04/05/2020   BILITOT 0.8 04/05/2020   ALKPHOS 39 04/05/2020   AST 28 04/05/2020   ALT 14 04/05/2020   ANIONGAP 9 04/05/2020      Objective    There were no vitals taken for this visit. BP Readings from Last 3 Encounters:  04/22/20 113/69  04/06/20 119/73  01/19/20 100/62   Wt Readings from Last 3 Encounters:  04/22/20 150 lb 9.6 oz (68.3 kg)  04/05/20 145 lb (65.8 kg)  01/19/20 150 lb (68  kg)      Physical Exam Vitals reviewed.  Constitutional:      Appearance: Normal appearance. She is well-developed.  HENT:     Head: Normocephalic and atraumatic.  Pulmonary:     Effort: Pulmonary effort is normal. No respiratory distress.  Musculoskeletal:     Cervical back: Normal range of motion and neck supple.  Neurological:     Mental Status: She is alert.  Psychiatric:        Mood and Affect: Mood normal.        Behavior: Behavior normal.        Thought Content: Thought content normal.         Judgment: Judgment normal.        Assessment & Plan     1. Dysmenorrhea School note provided. Continue conservative management.   2. BV (bacterial vaginosis) Worsening vaginal discharge with fishy odor, suspected to be BV. Will treat with metronidazole as below. F/U if not improving. - metroNIDAZOLE (FLAGYL) 500 MG tablet; Take 1 tablet (500 mg total) by mouth 2 (two) times daily.  Dispense: 14 tablet; Refill: 0   No follow-ups on file.     I discussed the assessment and treatment plan with the patient. The patient was provided an opportunity to ask questions and all were answered. The patient agreed with the plan and demonstrated an understanding of the instructions.   The patient was advised to call back or seek an in-person evaluation if the symptoms worsen or if the condition fails to improve as anticipated.  I provided 9 minutes of non-face-to-face time during this encounter.  Reynolds Bowl, PA-C, have reviewed all documentation for this visit. The documentation on 09/09/20 for the exam, diagnosis, procedures, and orders are all accurate and complete.  Rubye Beach [facility] [phone] (phone) [phone] (fax)  [name]

## 2020-09-09 NOTE — Patient Instructions (Signed)

## 2020-09-16 ENCOUNTER — Ambulatory Visit: Payer: Medicaid Other | Admitting: Physician Assistant

## 2020-09-16 NOTE — Progress Notes (Deleted)
     Established patient visit   Patient: Brianna Ellis   DOB: 06-25-2001   19 y.o. Female  MRN: [id number] Visit Date: 09/16/2020  Today's healthcare provider: Mar Daring, PA-C   No chief complaint on file.  Subjective    HPI  ***  {Show patient history (optional):23778::" "}   Medications: Outpatient Medications Prior to Visit  Medication Sig  . ibuprofen (ADVIL,MOTRIN) 800 MG tablet Take 1 tablet (800 mg total) by mouth every 8 (eight) hours as needed. (Patient not taking: Reported on 09/09/2020)  . metroNIDAZOLE (FLAGYL) 500 MG tablet Take 1 tablet (500 mg total) by mouth 2 (two) times daily.  . Norethindrone Acetate-Ethinyl Estrad-FE (LOESTRIN 24 FE) 1-20 MG-MCG(24) tablet Take 1 tablet by mouth daily.  . ondansetron (ZOFRAN) 4 MG tablet Take 4 mg by mouth every 8 (eight) hours as needed for nausea or vomiting.   No facility-administered medications prior to visit.    Review of Systems  {Heme  Chem  Endocrine  Serology  Results Review (optional):23779::" "}  Objective    There were no vitals taken for this visit. {Show previous vital signs (optional):23777::" "}  Physical Exam  ***  No results found for any visits on 09/16/20.  Assessment & Plan     ***  No follow-ups on file.      {provider attestation***:1}   Rubye Beach  [facility] [phone] (phone) [phone] (fax)  [place]

## 2020-10-14 ENCOUNTER — Ambulatory Visit (INDEPENDENT_AMBULATORY_CARE_PROVIDER_SITE_OTHER): Payer: Medicaid Other | Admitting: Adult Health

## 2020-10-14 ENCOUNTER — Encounter: Payer: Self-pay | Admitting: Adult Health

## 2020-10-14 ENCOUNTER — Other Ambulatory Visit: Payer: Self-pay

## 2020-10-14 VITALS — BP 108/73 | HR 64 | Temp 98.3°F | Resp 26 | Wt 142.0 lb

## 2020-10-14 DIAGNOSIS — S838X2A Sprain of other specified parts of left knee, initial encounter: Secondary | ICD-10-CM

## 2020-10-14 DIAGNOSIS — S8992XA Unspecified injury of left lower leg, initial encounter: Secondary | ICD-10-CM | POA: Diagnosis not present

## 2020-10-14 DIAGNOSIS — M25632 Stiffness of left wrist, not elsewhere classified: Secondary | ICD-10-CM | POA: Diagnosis not present

## 2020-10-14 DIAGNOSIS — M25362 Other instability, left knee: Secondary | ICD-10-CM | POA: Diagnosis not present

## 2020-10-14 NOTE — Patient Instructions (Signed)

## 2020-10-14 NOTE — Progress Notes (Signed)
Established patient visit   Patient: Brianna Ellis   DOB: 04/18/2001   19 y.o. Female  MRN: [id number] Visit Date: 10/14/2020  Today's healthcare provider: Marcille Buffy, FNP   Chief Complaint  Patient presents with   Leg Pain   Subjective    Leg Pain  There was no injury mechanism. The pain is present in the left leg (patient states that she was getting out of bed two nights ago and believes that she turned leg the wrong wau). Associated symptoms include an inability to bear weight, muscle weakness and tingling. Pertinent negatives include no loss of motion, loss of sensation or numbness. She reports no foreign bodies present. She has tried NSAIDs for the symptoms.   denies any twisting injury known.  Hisrory of left knee pain. She reports history of knee pain x 2 days ago.  She reports her left knee locked up for over 1 hour and she could not move it. She also has a bruised ares left  Lateral anterior knee. She feels bone on left side of leg popping, catching.  She denies any knee injury. She was working out with lifts of left legs last week,but did not notice any pain afterwards,  She is having problems with gait. Has left lateral anterior posterior knee pain.  Has had locking and buckling, hurts to bear weight.  Denies any surgeries  Patient  denies any fever,  chills, rash, chest pain, shortness of breath, nausea, vomiting, or diarrhea.     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 No pertinent past medical history. Past Surgical History:  Procedure Laterality Date   NO PAST SURGERIES     Social History   Tobacco Use   Smoking status: Never Smoker   Smokeless tobacco: Never Used  Vaping Use   Vaping Use: Every day   Substances: Nicotine, Flavoring  Substance Use Topics    Alcohol use: Yes    Comment: occasionaly   Drug use: Yes    Types: Marijuana   Social History   Socioeconomic History   Marital status: Single    Spouse name: Not on file   Number of children: Not on file   Years of education: Not on file   Highest education level: Not on file  Occupational History   Not on file  Tobacco Use   Smoking status: Never Smoker   Smokeless tobacco: Never Used  Vaping Use   Vaping Use: Every day   Substances: Nicotine, Flavoring  Substance and Sexual Activity   Alcohol use: Yes    Comment: occasionaly   Drug use: Yes    Types: Marijuana   Sexual activity: Yes    Birth control/protection: None  Other Topics Concern   Not on file  Social History Narrative   Not on file   Social Determinants of Health   Financial Resource Strain:    Difficulty of Paying Living Expenses: Not on file  Food Insecurity:    Worried About Charity fundraiser in the Last Year: Not on file   [employer] of Food in the Last Year: Not on file  Transportation Needs:    Lack of Transportation (Medical): Not on file   Lack of Transportation (Non-Medical): Not on file  Physical Activity:    Days of Exercise per Week: Not on file  Minutes of Exercise per Session: Not on file  Stress:    Feeling of Stress : Not on file  Social Connections:    Frequency of Communication with Friends and Family: Not on file   Frequency of Social Gatherings with Friends and Family: Not on file   Attends Religious Services: Not on file   Active Member of Clubs or Organizations: Not on file   Attends Archivist Meetings: Not on file   Marital Status: Not on file  Intimate Partner Violence:    Fear of Current or Ex-Partner: Not on file   Emotionally Abused: Not on file   Physically Abused: Not on file   Sexually Abused: Not on file   Family Status  Relation Name Status   Mother  Alive   Father  Alive   Brother  Alive   MGF  (Not Specified)    Family History  Problem Relation Age of Onset   Diabetes Mother    Hypertension Maternal Grandfather    No Known Allergies     Medications: Outpatient Medications Prior to Visit  Medication Sig   ibuprofen (ADVIL,MOTRIN) 800 MG tablet Take 1 tablet (800 mg total) by mouth every 8 (eight) hours as needed. (Patient not taking: Reported on 09/09/2020)   [DISCONTINUED] metroNIDAZOLE (FLAGYL) 500 MG tablet Take 1 tablet (500 mg total) by mouth 2 (two) times daily.   [DISCONTINUED] Norethindrone Acetate-Ethinyl Estrad-FE (LOESTRIN 24 FE) 1-20 MG-MCG(24) tablet Take 1 tablet by mouth daily.   [DISCONTINUED] ondansetron (ZOFRAN) 4 MG tablet Take 4 mg by mouth every 8 (eight) hours as needed for nausea or vomiting.   No facility-administered medications prior to visit.    Review of Systems  Constitutional: Negative.   HENT: Negative.   Respiratory: Negative.   Cardiovascular: Negative.   Gastrointestinal: Negative.   Genitourinary: Negative.   Musculoskeletal: Positive for arthralgias, gait problem and joint swelling. Negative for back pain, myalgias, neck pain and neck stiffness.  Skin: Positive for rash.  Neurological: Positive for tingling. Negative for numbness.  Psychiatric/Behavioral: Negative.     Last CBC Lab Results  Component Value Date   WBC 7.8 04/05/2020   HGB 12.3 04/05/2020   HCT 37.8 04/05/2020   MCV 89.2 04/05/2020   MCH 29.0 04/05/2020   RDW 11.8 04/05/2020   PLT 232 81/19/1478   Last metabolic panel Lab Results  Component Value Date   GLUCOSE 86 04/05/2020   NA 140 04/05/2020   K 3.7 04/05/2020   CL 107 04/05/2020   CO2 24 04/05/2020   BUN 10 04/05/2020   CREATININE 0.72 04/05/2020   GFRNONAA >60 04/05/2020   GFRAA >60 04/05/2020   CALCIUM 9.4 04/05/2020   PROT 8.3 (H) 04/05/2020   ALBUMIN 4.5 04/05/2020   BILITOT 0.8 04/05/2020   ALKPHOS 39 04/05/2020   AST 28 04/05/2020   ALT 14 04/05/2020   ANIONGAP 9 04/05/2020      Objective      BP 108/73    Pulse 64    Temp 98.3 F (36.8 C) (Oral)    Resp (!) 26    Wt 142 lb (64.4 kg)    SpO2 98%    BMI 21.59 kg/m    Physical Exam Vitals reviewed.  Constitutional:      General: She is not in acute distress.    Appearance: Normal appearance. She is not ill-appearing, toxic-appearing or diaphoretic.  HENT:     Head: Normocephalic and atraumatic.     Right Ear: There is  no impacted cerumen.     Left Ear: There is no impacted cerumen.     Mouth/Throat:     Mouth: Mucous membranes are moist.  Eyes:     Conjunctiva/sclera: Conjunctivae normal.  Cardiovascular:     Pulses: Normal pulses.     Heart sounds: Normal heart sounds.  Pulmonary:     Effort: Pulmonary effort is normal.     Breath sounds: Normal breath sounds.  Abdominal:     Palpations: Abdomen is soft.  Musculoskeletal:        General: Swelling and tenderness present.     Cervical back: Normal range of motion and neck supple. No rigidity.     Left knee: Swelling, ecchymosis (left lateral knee ) and bony tenderness present. No deformity, effusion or erythema. Tenderness present over the LCL. LCL laxity present. No MCL laxity.Abnormal meniscus. Normal pulse.     Instability Tests: Lateral McMurray test positive.     Left lower leg: Swelling (mild at left knee anterior ), tenderness and bony tenderness present. No deformity or lacerations. No edema.       Legs:     Comments: No warmth.  Pulse popliteal and DP 2+  Limited exam of left knee due to pain and locking. Further exam deferred. Unable to bear weight.   Skin:    General: Skin is warm.     Capillary Refill: Capillary refill takes less than 2 seconds.     Findings: Bruising (left lateral leg at knee ) present. No erythema or rash.  Neurological:     General: No focal deficit present.     Mental Status: She is alert and oriented to person, place, and time.     Motor: No weakness.     Gait: Gait abnormal.  Psychiatric:        Mood and Affect: Mood normal.         Behavior: Behavior normal.        Thought Content: Thought content normal.        Judgment: Judgment normal.       No results found for any visits on 10/14/20.  Assessment & Plan     Injury of left knee, initial encounter - Plan: Ambulatory referral to Orthopedic Surgery  Meniscal injury suspected , left, initial encounter  Given severity of symptoms she has had and exam, suspect meniscal injury or tear. Offered x rays, however given symptoms she agrees she will go to [facility] walk in today for imaging and further evaluation.   Will not obtain imaging since orthopedics will do as well in office. She will go now.    Orders Placed This Encounter  Procedures   Ambulatory referral to Orthopedic Surgery    Referral Priority:   Urgent    Referral Type:   Surgical    Referral Reason:   Specialty Services Required    Requested Specialty:   Orthopedic Surgery    Number of Visits Requested:   1   Patient verbalized understanding of all instructions given and denies any further questions at this time.   See orthopedics now. . Rest and elevate the affected painful area.  Apply cold compresses intermittently as needed.  As pain recedes, begin normal activities slowly as tolerated.  Call if symptoms persist.  Red Flags discussed. The patient was given clear instructions to go to ER or return to medical center if any red flags develop, symptoms do not improve, worsen or new problems develop. They verbalized understanding.  Return in  about 1 week (around 10/21/2020), or if symptoms worsen or fail to improve, for at any time for any worsening symptoms, Go to Emergency room/ urgent care if worse.      Time spent 30 minutes.   Marcille Buffy, [place] [phone] (phone) [phone] (fax)  [place]

## 2020-10-15 ENCOUNTER — Telehealth: Payer: Self-pay

## 2020-10-15 NOTE — Telephone Encounter (Signed)
Can you see if she was seen at [facility], they should give return to school note. I am fine with giving a note just want to be sure she was seen and had imaging. I do not see a note in care everywhere.

## 2020-10-15 NOTE — Telephone Encounter (Signed)
Patient was seen by you yesterday in office. KW   Copied from [place] [id number]. Topic: General - Inquiry >> Oct 14, 2020 12:19 PM Greggory Keen D wrote: Pt called saying she needs a note for school to return tomorrow.  She had an appt today.    CB#  [phone]

## 2020-10-15 NOTE — Telephone Encounter (Signed)
Spoke with patient on the phone who states that she did go to [facility] and that a note was no longer needed because they provided her with one. KW

## 2020-10-16 ENCOUNTER — Telehealth: Payer: Self-pay

## 2020-10-16 NOTE — Telephone Encounter (Signed)
Copied from [place] [id number]. Topic: General - Other >> Oct 16, 2020  9:11 AM Leward Quan A wrote: Reason for CRM: Patient called in to inquire from Fenton Malling or Sharyn Lull Flinchum whom she last saw if she can get a note for school for today because she woke up with her leg and knee in tremendous pain asking for an excuse note please. Asking for a call back at Ph [phone]

## 2020-10-16 NOTE — Telephone Encounter (Signed)
Please advise or does patient need to contact [facility]? KW

## 2020-10-16 NOTE — Telephone Encounter (Signed)
Provider spoke with patinet and sent her work note to My chart. Thanks.

## 2020-10-17 DIAGNOSIS — S8992XA Unspecified injury of left lower leg, initial encounter: Secondary | ICD-10-CM | POA: Insufficient documentation

## 2020-10-21 ENCOUNTER — Telehealth: Payer: Self-pay | Admitting: Physician Assistant

## 2020-10-21 NOTE — Telephone Encounter (Signed)
Patient is calling to request a note for school to request a note for school to be excused with a return date of Wednesday 11/3/221- Patient states with the crutches it is diffucult. Patient would like to know if she should wear the brace at night? She finds that she moves around in her sleep and then it hurts in the morning. Please advise C- [phone]

## 2020-10-22 NOTE — Telephone Encounter (Signed)
Was sent to Mychart, see message below to communicate with patient. Should follow up with [facility] for brace wear and further evaluation/ work up if needed or symptoms persisting.

## 2020-10-22 NOTE — Telephone Encounter (Signed)
Noted great. Thanks

## 2020-10-22 NOTE — Progress Notes (Signed)
New work note sent.

## 2020-10-22 NOTE — Telephone Encounter (Signed)
Patient was advised she states that she has a MRI scheduled for tomorrow morning and has a follow up with Dr. Mack Guise at [facility] on Friday to discuss results of MRI. KW

## 2020-10-22 NOTE — Telephone Encounter (Signed)
Will send new school  note. Kat please advise patient that given the severity of her symptom she should follow back up with [facility] and follow their instructions on brace wear.

## 2020-10-23 DIAGNOSIS — M25362 Other instability, left knee: Secondary | ICD-10-CM | POA: Diagnosis not present

## 2020-10-25 DIAGNOSIS — S8392XD Sprain of unspecified site of left knee, subsequent encounter: Secondary | ICD-10-CM | POA: Diagnosis not present

## 2020-10-25 DIAGNOSIS — M25562 Pain in left knee: Secondary | ICD-10-CM | POA: Diagnosis not present

## 2020-12-25 DIAGNOSIS — M25562 Pain in left knee: Secondary | ICD-10-CM | POA: Diagnosis not present

## 2021-01-23 ENCOUNTER — Telehealth (INDEPENDENT_AMBULATORY_CARE_PROVIDER_SITE_OTHER): Payer: Medicaid Other | Admitting: Physician Assistant

## 2021-01-23 DIAGNOSIS — N760 Acute vaginitis: Secondary | ICD-10-CM

## 2021-01-23 DIAGNOSIS — B9689 Other specified bacterial agents as the cause of diseases classified elsewhere: Secondary | ICD-10-CM

## 2021-01-23 DIAGNOSIS — B001 Herpesviral vesicular dermatitis: Secondary | ICD-10-CM

## 2021-01-23 MED ORDER — METRONIDAZOLE 500 MG PO TABS: 500.0000 mg | ORAL_TABLET | Freq: Two times a day (BID) | ORAL | 0 refills | Status: DC

## 2021-01-23 MED ORDER — VALACYCLOVIR HCL 1 G PO TABS: ORAL_TABLET | ORAL | 5 refills | Status: DC

## 2021-01-23 MED ORDER — VALACYCLOVIR HCL 1 G PO TABS: ORAL_TABLET | ORAL | 0 refills | Status: DC

## 2021-01-23 NOTE — Progress Notes (Signed)
MyChart Video Visit    Virtual Visit via Video Note   This visit type was conducted due to national recommendations for restrictions regarding the COVID-19 Pandemic (e.g. social distancing) in an effort to limit this patient's exposure and mitigate transmission in our community. This patient is at least at moderate risk for complications without adequate follow up. This format is felt to be most appropriate for this patient at this time. Physical exam was limited by quality of the video and audio technology used for the visit.   Patient location: work Provider location: [facility]  I discussed the limitations of evaluation and management by telemedicine and the availability of in person appointments. The patient expressed understanding and agreed to proceed.  Patient: Brianna Ellis   DOB: May 01, 2001   20 y.o. Female  MRN: [id number] Visit Date: 01/23/2021  Today's healthcare provider: Mar Daring, PA-C   Chief Complaint  Patient presents with  . Mouth Lesions   Subjective    Mouth Lesions  The current episode started 5 to 7 days ago. The onset was sudden. The problem has been gradually improving. Associated symptoms include mouth sores.  Mouth sore is on upper lip. Consistent with cold sores. Gets them annually.    Pt is also concern she may have a reoccurrence of BV.   Patient Active Problem List   Diagnosis Date Noted  . Injury of left knee 10/17/2020  . 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 Abdominal pain 04/23/2020  . Encounter for counseling regarding contraception 01/19/2020   No past medical history on file. Social History   Tobacco Use  . Smoking status: Never Smoker  . Smokeless tobacco: Never Used  Vaping Use  . Vaping Use: Every day  . Substances: Nicotine, Flavoring  Substance Use Topics  . Alcohol use: Yes    Comment: occasionaly  . Drug use: Yes    Types: Marijuana   No Known Allergies  Medications: Outpatient Medications Prior  to Visit  Medication Sig  . ibuprofen (ADVIL,MOTRIN) 800 MG tablet Take 1 tablet (800 mg total) by mouth every 8 (eight) hours as needed.   No facility-administered medications prior to visit.    Review of Systems  Constitutional: Negative.   HENT: Positive for mouth sores.   Respiratory: Negative.   Cardiovascular: Negative.   Gastrointestinal: Negative.   Genitourinary: Positive for vaginal discharge. Negative for decreased urine volume, urgency and vaginal pain.      Objective    There were no vitals taken for this visit.   Physical Exam Vitals reviewed.  Constitutional:      Appearance: She is well-developed and well-nourished.  HENT:     Head: Normocephalic and atraumatic.  Eyes:     Extraocular Movements: EOM normal.  Pulmonary:     Effort: Pulmonary effort is normal. No respiratory distress.  Musculoskeletal:     Cervical back: Normal range of motion and neck supple.  Psychiatric:        Mood and Affect: Mood and affect normal.        Behavior: Behavior normal.        Thought Content: Thought content normal.        Judgment: Judgment normal.        Assessment & Plan     1. BV (bacterial vaginosis) Metronidazole given for recurrence of BV. Call if not improving.  - metroNIDAZOLE (FLAGYL) 500 MG tablet; Take 1 tablet (500 mg total) by mouth 2 (two) times daily.  Dispense:  14 tablet; Refill: 0  2. Cold sore Valtrex given for cold sore on upper lip. Call if not improving.  - valACYclovir (VALTREX) 1000 MG tablet; Take 2 tabs at onset of cold sore, then take take 1 tab PO BID x 5 days  Dispense: 20 tablet; Refill: 5   No follow-ups on file.     I discussed the assessment and treatment plan with the patient. The patient was provided an opportunity to ask questions and all were answered. The patient agreed with the plan and demonstrated an understanding of the instructions.   The patient was advised to call back or seek an in-person evaluation if the  symptoms worsen or if the condition fails to improve as anticipated.  I provided 8 minutes of face-to-face time during this encounter via MyChart Video enabled encounter.   Reynolds Bowl, PA-C, have reviewed all documentation for this visit. The documentation on 02/11/21 for the exam, diagnosis, procedures, and orders are all accurate and complete.  Rubye Beach [facility] [phone] (phone) [phone] (fax)  [place]

## 2021-02-03 DIAGNOSIS — S83115A Anterior dislocation of proximal end of tibia, left knee, initial encounter: Secondary | ICD-10-CM | POA: Diagnosis not present

## 2021-02-03 DIAGNOSIS — W1839XA Other fall on same level, initial encounter: Secondary | ICD-10-CM | POA: Diagnosis not present

## 2021-02-11 ENCOUNTER — Encounter: Payer: Self-pay | Admitting: Physician Assistant

## 2021-03-07 ENCOUNTER — Ambulatory Visit: Payer: Self-pay | Admitting: *Deleted

## 2021-03-07 NOTE — Telephone Encounter (Signed)
FYI

## 2021-03-07 NOTE — Telephone Encounter (Signed)
C/o abdominal pain and bloating after eating. Upper abdomen pain gradual , sharp, stabbing at times for a couple of weeks. Tender to touch under rib cage. Takes ibuprofen with some relief. Reports constipation. Poor appetite. Only eats maybe twice a day. Not drinking fluids as much as she should. Denies fever, chest pain of vomiting. Nausea reported when she first wakes in the morning.  Earliest appt 03/12/21. Care advise given. Patient verbalized understanding of care advise and to call back or go to [facility] or ED if symptoms worsen.   Reason for Disposition . [1] MILD pain (e.g., does not interfere with normal activities) AND [2] pain comes and goes (cramps) AND [3] present > 48 hours  (Exception: this same abdominal pain is a chronic symptom recurrent or ongoing AND present > 4 weeks)  Answer Assessment - Initial Assessment Questions 1. LOCATION: "Where does it hurt?"      Upper middle abdomen 2. RADIATION: "Does the pain shoot anywhere else?" (e.g., chest, back)     no 3. ONSET: "When did the pain begin?" (e.g., minutes, hours or days ago)      Couple of weeks ago  4. SUDDEN: "Gradual or sudden onset?"     gradual 5. PATTERN "Does the pain come and go, or is it constant?"    - If constant: "Is it getting better, staying the same, or worsening?"      (Note: Constant means the pain never goes away completely; most serious pain is constant and it progresses)     - If intermittent: "How long does it last?" "Do you have pain now?"     (Note: Intermittent means the pain goes away completely between bouts)     Comes and goes 6. SEVERITY: "How bad is the pain?"  (e.g., Scale 1-10; mild, moderate, or severe)   - MILD (1-3): doesn't interfere with normal activities, abdomen soft and not tender to touch    - MODERATE (4-7): interferes with normal activities or awakens from sleep, tender to touch    - SEVERE (8-10): excruciating pain, doubled over, unable to do any normal activities      Moderate, tender  to touch  7. RECURRENT SYMPTOM: "Have you ever had this type of stomach pain before?" If Yes, ask: "When was the last time?" and "What happened that time?"      Not like this  8. CAUSE: "What do you think is causing the stomach pain?"     Not sure  9. RELIEVING/AGGRAVATING FACTORS: "What makes it better or worse?" (e.g., movement, antacids, bowel movement)     ibuprofen 10. OTHER SYMPTOMS: "Has there been any vomiting, diarrhea, constipation, or urine problems?"       constipation 11. PREGNANCY: "Is there any chance you are pregnant?" "When was your last menstrual period?"       No , periods every month  Protocols used: ABDOMINAL PAIN - [facility]

## 2021-03-12 ENCOUNTER — Ambulatory Visit (INDEPENDENT_AMBULATORY_CARE_PROVIDER_SITE_OTHER): Payer: Medicaid Other | Admitting: Physician Assistant

## 2021-03-12 ENCOUNTER — Other Ambulatory Visit: Payer: Self-pay

## 2021-03-12 ENCOUNTER — Encounter: Payer: Self-pay | Admitting: Physician Assistant

## 2021-03-12 VITALS — BP 99/70 | HR 91 | Temp 98.6°F | Resp 16 | Ht 68.0 in | Wt 152.5 lb

## 2021-03-12 DIAGNOSIS — K5909 Other constipation: Secondary | ICD-10-CM

## 2021-03-12 DIAGNOSIS — I871 Compression of vein: Secondary | ICD-10-CM | POA: Diagnosis not present

## 2021-03-12 DIAGNOSIS — R1013 Epigastric pain: Secondary | ICD-10-CM | POA: Diagnosis not present

## 2021-03-12 MED ORDER — POLYETHYLENE GLYCOL 3350 17 GM/SCOOP PO POWD: 17.0000 g | Freq: Two times a day (BID) | ORAL | 1 refills | Status: DC | PRN

## 2021-03-12 NOTE — Patient Instructions (Signed)
Constipation, Adult Constipation is when a person has trouble pooping (having a bowel movement). When you have this condition, you may poop fewer than 3 times a week. Your poop (stool) may also be dry, hard, or bigger than normal. Follow these instructions at home: Eating and drinking  Eat foods that have a lot of fiber, such as: ? Fresh fruits and vegetables. ? Whole grains. ? Beans.  Eat less of foods that are low in fiber and high in fat and sugar, such as: ? [place] fries. ? Hamburgers. ? Cookies. ? Candy. ? Soda.  Drink enough fluid to keep your pee (urine) pale yellow.   General instructions  Exercise regularly or as told by your doctor. Try to do 150 minutes of exercise each week.  Go to the restroom when you feel like you need to poop. Do not hold it in.  Take over-the-counter and prescription medicines only as told by your doctor. These include any fiber supplements.  When you poop: ? Do deep breathing while relaxing your lower belly (abdomen). ? Relax your pelvic floor. The pelvic floor is a group of muscles that support the rectum, bladder, and intestines (as well as the uterus in women).  Watch your condition for any changes. Tell your doctor if you notice any.  Keep all follow-up visits as told by your doctor. This is important. Contact a doctor if:  You have pain that gets worse.  You have a fever.  You have not pooped for 4 days.  You vomit.  You are not hungry.  You lose weight.  You are bleeding from the opening of the butt (anus).  You have thin, pencil-like poop. Get help right away if:  You have a fever, and your symptoms suddenly get worse.  You leak poop or have blood in your poop.  Your belly feels hard or bigger than normal (bloated).  You have very bad belly pain.  You feel dizzy or you faint. Summary  Constipation is when a person poops fewer than 3 times a week, has trouble pooping, or has poop that is dry, hard, or bigger than  normal.  Eat foods that have a lot of fiber.  Drink enough fluid to keep your pee (urine) pale yellow.  Take over-the-counter and prescription medicines only as told by your doctor. These include any fiber supplements. This information is not intended to replace advice given to you by your health care provider. Make sure you discuss any questions you have with your health care provider. Document Revised: 10/25/2019 Document Reviewed: 10/25/2019 Elsevier Patient Education  2021 [place]. Abdominal Pain, Adult Many things can cause belly (abdominal) pain. Most times, belly pain is not dangerous. Many cases of belly pain can be watched and treated at home. Sometimes, though, belly pain is serious. Your doctor will try to find the cause of your belly pain. Follow these instructions at home: Medicines  Take over-the-counter and prescription medicines only as told by your doctor.  Do not take medicines that help you poop (laxatives) unless told by your doctor. General instructions  Watch your belly pain for any changes.  Drink enough fluid to keep your pee (urine) pale yellow.  Keep all follow-up visits as told by your doctor. This is important.   Contact a doctor if:  Your belly pain changes or gets worse.  You are not hungry, or you lose weight without trying.  You are having trouble pooping (constipated) or have watery poop (diarrhea) for more than 2-3  days.  You have pain when you pee or poop.  Your belly pain wakes you up at night.  Your pain gets worse with meals, after eating, or with certain foods.  You are vomiting and cannot keep anything down.  You have a fever.  You have blood in your pee. Get help right away if:  Your pain does not go away as soon as your doctor says it should.  You cannot stop vomiting.  Your pain is only in areas of your belly, such as the right side or the left lower part of the belly.  You have bloody or black poop, or poop that  looks like tar.  You have very bad pain, cramping, or bloating in your belly.  You have signs of not having enough fluid or water in your body (dehydration), such as: ? Dark pee, very little pee, or no pee. ? Cracked lips. ? Dry mouth. ? Sunken eyes. ? Sleepiness. ? Weakness.  You have trouble breathing or chest pain. Summary  Many cases of belly pain can be watched and treated at home.  Watch your belly pain for any changes.  Take over-the-counter and prescription medicines only as told by your doctor.  Contact a doctor if your belly pain changes or gets worse.  Get help right away if you have very bad pain, cramping, or bloating in your belly. This information is not intended to replace advice given to you by your health care provider. Make sure you discuss any questions you have with your health care provider. Document Revised: 04/17/2019 Document Reviewed: 04/17/2019 Elsevier Patient Education  [place].

## 2021-03-12 NOTE — Progress Notes (Signed)
Established patient visit   Patient: Brianna Ellis   DOB: 08-13-2001   20 y.o. Female  MRN: [id number] Visit Date: 03/12/2021  Today's healthcare provider: Mar Daring, PA-C   Chief Complaint  Patient presents with  . Abdominal Pain   Subjective    Abdominal Pain This is a new problem. The current episode started 1 to 4 weeks ago. The onset quality is gradual. The problem occurs 2 to 4 times per day. Duration: minutes. The problem has been unchanged. The pain is located in the epigastric region, left flank and right flank. The pain is moderate. The quality of the pain is sharp. The abdominal pain radiates to the right flank and left flank. Associated symptoms include constipation, diarrhea, flatus, myalgias and nausea. Pertinent negatives include no anorexia, belching, dysuria, fever, frequency, headaches, hematochezia, hematuria, melena, vomiting or weight loss. The pain is aggravated by eating and certain positions. The pain is relieved by certain positions. Treatments tried: ibuprofen. The treatment provided mild relief.    Patient Active Problem List   Diagnosis Date Noted  . Injury of left knee 10/17/2020  . 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 Abdominal pain 04/23/2020  . Encounter for counseling regarding contraception 01/19/2020   Social History   Tobacco Use  . Smoking status: Never Smoker  . Smokeless tobacco: Never Used  Vaping Use  . Vaping Use: Every day  . Substances: Nicotine, Flavoring  Substance Use Topics  . Alcohol use: Yes    Comment: occasionaly  . Drug use: Yes    Types: Marijuana   No Known Allergies     Medications: Outpatient Medications Prior to Visit  Medication Sig  . ibuprofen (ADVIL,MOTRIN) 800 MG tablet Take 1 tablet (800 mg total) by mouth every 8 (eight) hours as needed.  . norethindrone-ethinyl estradiol (LOESTRIN) 1-20 MG-MCG tablet Aurovela 24 Fe 1 mg-20 mcg (24)/75 mg (4) tablet  TAKE 1 TABLET BY MOUTH  DAILY  . valACYclovir (VALTREX) 1000 MG tablet Take 2 tabs at onset of cold sore, then take take 1 tab PO BID x 5 days  . [DISCONTINUED] metroNIDAZOLE (FLAGYL) 500 MG tablet Take 1 tablet (500 mg total) by mouth 2 (two) times daily.   No facility-administered medications prior to visit.    Review of Systems  Constitutional: Positive for appetite change. Negative for chills, fatigue, fever, unexpected weight change and weight loss.  Respiratory: Negative for cough, shortness of breath and wheezing.   Gastrointestinal: Positive for abdominal pain, constipation, diarrhea, flatus and nausea. Negative for anorexia, blood in stool, hematochezia, melena and vomiting.  Genitourinary: Negative for dysuria, frequency and hematuria.  Musculoskeletal: Positive for myalgias.  Neurological: Negative for headaches.    Last CBC Lab Results  Component Value Date   WBC 7.8 04/05/2020   HGB 12.3 04/05/2020   HCT 37.8 04/05/2020   MCV 89.2 04/05/2020   MCH 29.0 04/05/2020   RDW 11.8 04/05/2020   PLT 232 87/86/7672   Last metabolic panel Lab Results  Component Value Date   GLUCOSE 86 04/05/2020   NA 140 04/05/2020   K 3.7 04/05/2020   CL 107 04/05/2020   CO2 24 04/05/2020   BUN 10 04/05/2020   CREATININE 0.72 04/05/2020   GFRNONAA >60 04/05/2020   GFRAA >60 04/05/2020   CALCIUM 9.4 04/05/2020   PROT 8.3 (H) 04/05/2020   ALBUMIN 4.5 04/05/2020   BILITOT 0.8 04/05/2020   ALKPHOS 39 04/05/2020   AST 28 04/05/2020   ALT  14 04/05/2020   ANIONGAP 9 04/05/2020   Last lipids No results found for: CHOL, HDL, LDLCALC, LDLDIRECT, TRIG, CHOLHDL      Objective    BP 99/70 (BP Location: Right Arm, Patient Position: Sitting, Cuff Size: Normal)   Pulse 91   Temp 98.6 F (37 C) (Oral)   Resp 16   Ht 5\' 8"  (1.727 m)   Wt 152 lb 8 oz (69.2 kg)   LMP 02/05/2021 (Approximate)   SpO2 100%   BMI 23.19 kg/m  BP Readings from Last 3 Encounters:  03/12/21 99/70  10/14/20 108/73  04/22/20 113/69    Wt Readings from Last 3 Encounters:  03/12/21 152 lb 8 oz (69.2 kg)  10/14/20 142 lb (64.4 kg)  04/22/20 150 lb 9.6 oz (68.3 kg)      Physical Exam Constitutional:      General: She is not in acute distress.    Appearance: Normal appearance. She is well-developed and normal weight. She is not ill-appearing or diaphoretic.  HENT:     Head: Normocephalic and atraumatic.  Cardiovascular:     Rate and Rhythm: Normal rate and regular rhythm.     Heart sounds: Normal heart sounds. No murmur heard. No friction rub. No gallop.   Pulmonary:     Effort: Pulmonary effort is normal. No respiratory distress.     Breath sounds: Normal breath sounds. No wheezing or rales.  Abdominal:     General: Abdomen is flat. Bowel sounds are normal. There is no distension.     Palpations: Abdomen is soft. There is no mass.     Tenderness: There is abdominal tenderness in the epigastric area, left upper quadrant and left lower quadrant. There is no guarding or rebound. Negative signs include Murphy's sign, Rovsing's sign and McBurney's sign.     Hernia: No hernia is present.  Skin:    General: Skin is warm and dry.  Neurological:     Mental Status: She is alert and oriented to person, place, and time.      No results found for any visits on 03/12/21.  Assessment & Plan     1. Other constipation Suspect possible constipation and possibly overlapping GERD. Will try Miralax as below for constipation. Advised patient to let me know on Monday how this helps. May need to also add omeprazole. Will f/u in 1 week.  - polyethylene glycol powder (GLYCOLAX/MIRALAX) 17 GM/SCOOP powder; Take 17 g by mouth 2 (two) times daily as needed.  Dispense: 3350 g; Refill: 1  2. 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 Saw Dr. Delana Meyer in 5/21 and he recommended [facility] follow up. This appointment was never done. Will place new referral to make sure not contributing.  - Ambulatory referral to Vascular  Surgery  3. Epigastric abdominal pain See above medical treatment plan for #1.   No follow-ups on file.      Reynolds Bowl, PA-C, have reviewed all documentation for this visit. The documentation on 03/12/21 for the exam, diagnosis, procedures, and orders are all accurate and complete.   Rubye Beach  [facility] [phone] (phone) [phone] (fax)  [place]

## 2021-03-20 ENCOUNTER — Encounter: Payer: Self-pay | Admitting: Physician Assistant

## 2021-04-17 DIAGNOSIS — S83115A Anterior dislocation of proximal end of tibia, left knee, initial encounter: Secondary | ICD-10-CM | POA: Diagnosis not present

## 2021-04-17 DIAGNOSIS — S8362XD Sprain of the superior tibiofibular joint and ligament, left knee, subsequent encounter: Secondary | ICD-10-CM | POA: Insufficient documentation

## 2021-04-17 DIAGNOSIS — M2352 Chronic instability of knee, left knee: Secondary | ICD-10-CM | POA: Insufficient documentation

## 2021-04-17 DIAGNOSIS — M66852 Spontaneous rupture of other tendons, left thigh: Secondary | ICD-10-CM | POA: Diagnosis not present

## 2021-04-29 DIAGNOSIS — M2352 Chronic instability of knee, left knee: Secondary | ICD-10-CM | POA: Diagnosis not present

## 2021-05-05 DIAGNOSIS — M25562 Pain in left knee: Secondary | ICD-10-CM | POA: Diagnosis not present

## 2021-05-05 DIAGNOSIS — R29898 Other symptoms and signs involving the musculoskeletal system: Secondary | ICD-10-CM | POA: Diagnosis not present

## 2021-05-05 DIAGNOSIS — M25662 Stiffness of left knee, not elsewhere classified: Secondary | ICD-10-CM | POA: Diagnosis not present

## 2021-05-12 DIAGNOSIS — M25562 Pain in left knee: Secondary | ICD-10-CM | POA: Diagnosis not present

## 2021-05-20 DIAGNOSIS — M25562 Pain in left knee: Secondary | ICD-10-CM | POA: Diagnosis not present

## 2021-05-29 DIAGNOSIS — M25562 Pain in left knee: Secondary | ICD-10-CM | POA: Diagnosis not present

## 2021-06-02 DIAGNOSIS — M25562 Pain in left knee: Secondary | ICD-10-CM | POA: Diagnosis not present

## 2021-06-17 DIAGNOSIS — M25562 Pain in left knee: Secondary | ICD-10-CM | POA: Diagnosis not present

## 2021-06-25 DIAGNOSIS — M25562 Pain in left knee: Secondary | ICD-10-CM | POA: Diagnosis not present

## 2021-06-27 DIAGNOSIS — I871 Compression of vein: Secondary | ICD-10-CM | POA: Diagnosis not present

## 2021-07-21 DIAGNOSIS — M2352 Chronic instability of knee, left knee: Secondary | ICD-10-CM | POA: Diagnosis not present

## 2021-07-21 DIAGNOSIS — M66852 Spontaneous rupture of other tendons, left thigh: Secondary | ICD-10-CM | POA: Diagnosis not present

## 2021-07-21 DIAGNOSIS — S8362XD Sprain of the superior tibiofibular joint and ligament, left knee, subsequent encounter: Secondary | ICD-10-CM | POA: Diagnosis not present

## 2021-07-21 DIAGNOSIS — X500XXD Overexertion from strenuous movement or load, subsequent encounter: Secondary | ICD-10-CM | POA: Diagnosis not present

## 2021-07-22 DIAGNOSIS — M25562 Pain in left knee: Secondary | ICD-10-CM | POA: Diagnosis not present

## 2021-07-26 DIAGNOSIS — B373 Candidiasis of vulva and vagina: Secondary | ICD-10-CM | POA: Diagnosis not present

## 2021-07-26 DIAGNOSIS — B9689 Other specified bacterial agents as the cause of diseases classified elsewhere: Secondary | ICD-10-CM | POA: Diagnosis not present

## 2021-07-26 DIAGNOSIS — N760 Acute vaginitis: Secondary | ICD-10-CM | POA: Diagnosis not present

## 2021-08-01 DIAGNOSIS — I871 Compression of vein: Secondary | ICD-10-CM | POA: Diagnosis not present

## 2021-09-09 DIAGNOSIS — M25562 Pain in left knee: Secondary | ICD-10-CM | POA: Diagnosis not present

## 2021-09-22 DIAGNOSIS — X500XXD Overexertion from strenuous movement or load, subsequent encounter: Secondary | ICD-10-CM | POA: Diagnosis not present

## 2021-09-22 DIAGNOSIS — S8362XD Sprain of the superior tibiofibular joint and ligament, left knee, subsequent encounter: Secondary | ICD-10-CM | POA: Diagnosis not present

## 2021-09-22 DIAGNOSIS — M66852 Spontaneous rupture of other tendons, left thigh: Secondary | ICD-10-CM | POA: Diagnosis not present

## 2021-09-22 DIAGNOSIS — M2352 Chronic instability of knee, left knee: Secondary | ICD-10-CM | POA: Diagnosis not present

## 2021-12-03 IMAGING — CT CT ABD-PELV W/ CM
2 of 4 series · 15 of 46 positions shown, 17 images · IV contrast (APPLIED)
Comparison: None

CLINICAL DATA: Abdominal pain, swelling and distension

EXAM:
CT ABDOMEN AND PELVIS WITH CONTRAST
TECHNIQUE: Multidetector CT imaging of the abdomen and pelvis was performed
using the standard protocol following bolus administration of
intravenous contrast.
CONTRAST:  100mL OMNIPAQUE IOHEXOL 300 MG/ML  SOLN

[Series 2: routine abd/pel with · axial · 0.67mm/px · z∈[-965,-595]mm · 12 of 86 slices shown, 14 images]
[im 6/86  soft-tissue]
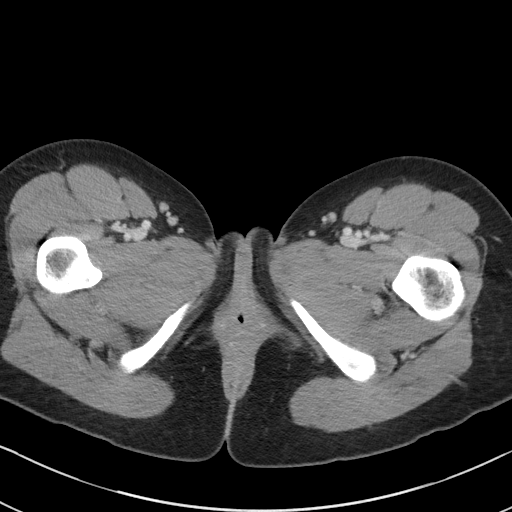
[im 6/86  bone]
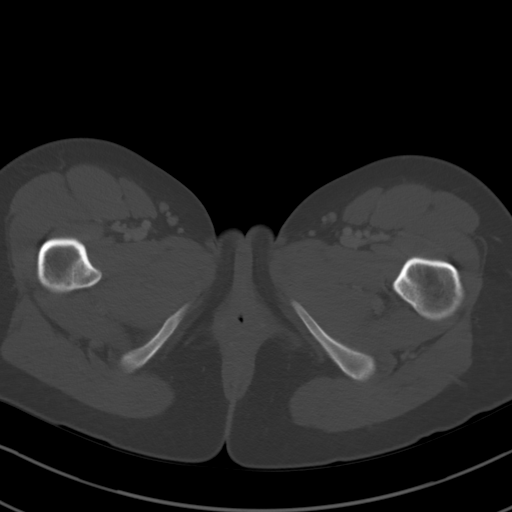
[im 12/86  soft-tissue]
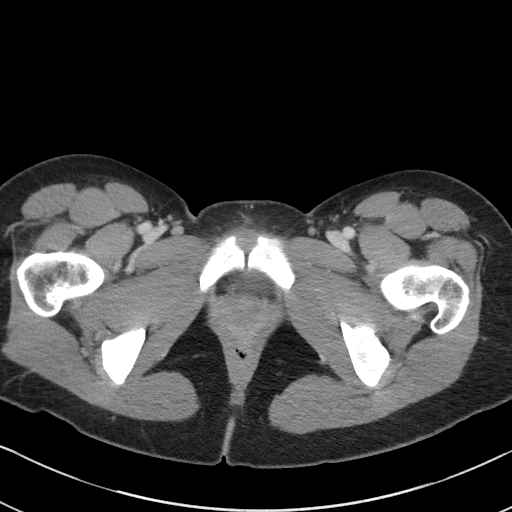
[im 20/86  soft-tissue]
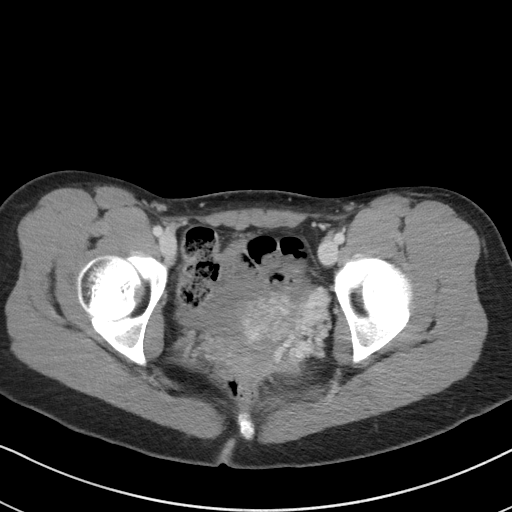
[im 26/86  soft-tissue]
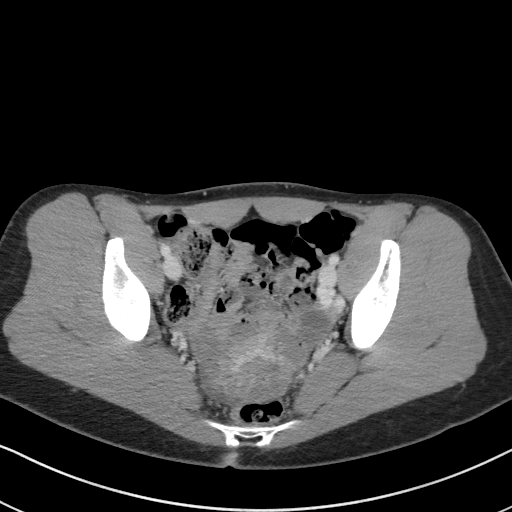
[im 32/86  soft-tissue]
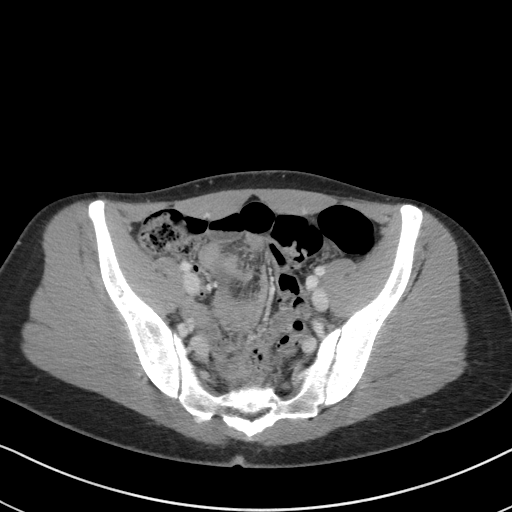
[im 40/86  soft-tissue]
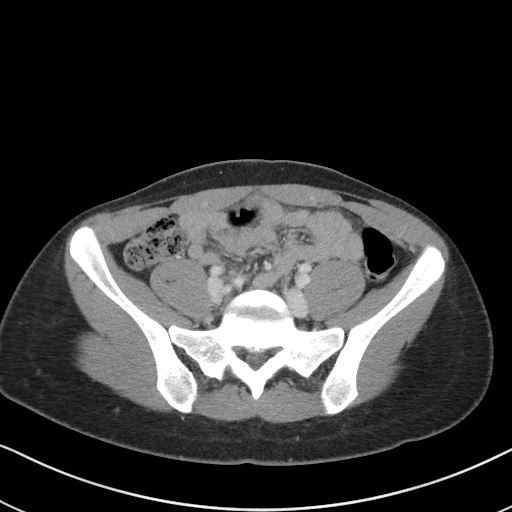
[im 46/86  soft-tissue]
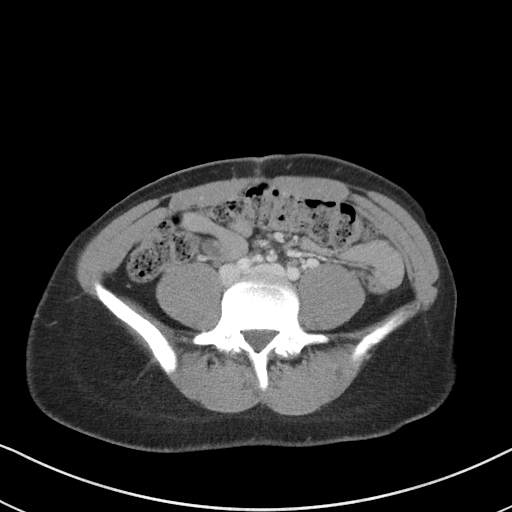
[im 54/86  soft-tissue]
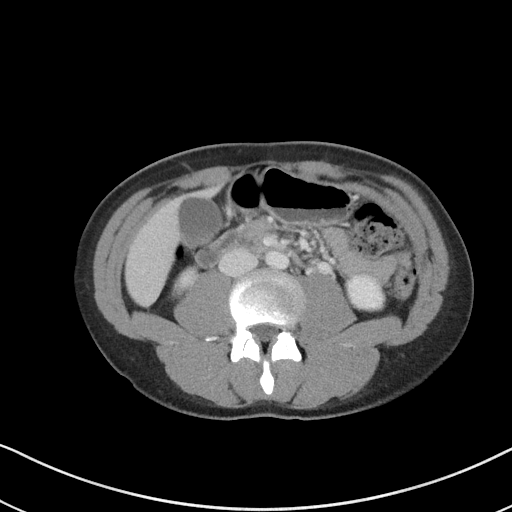
[im 60/86  soft-tissue]
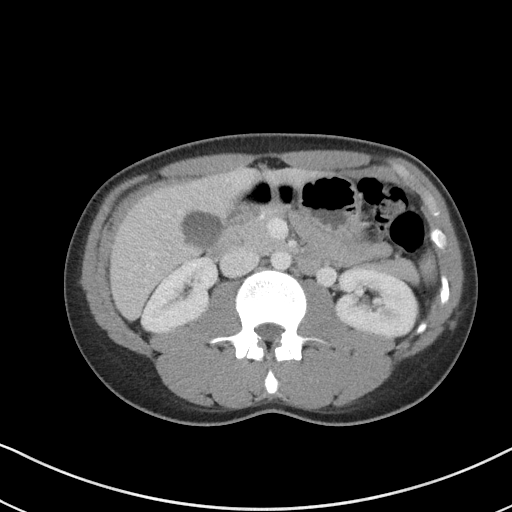
[im 60/86  bone]
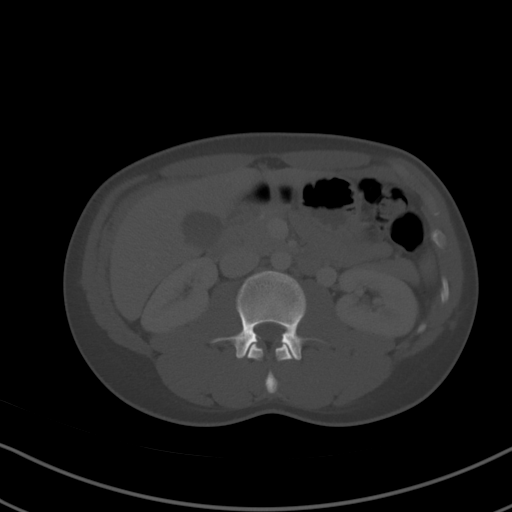
[im 66/86  soft-tissue]
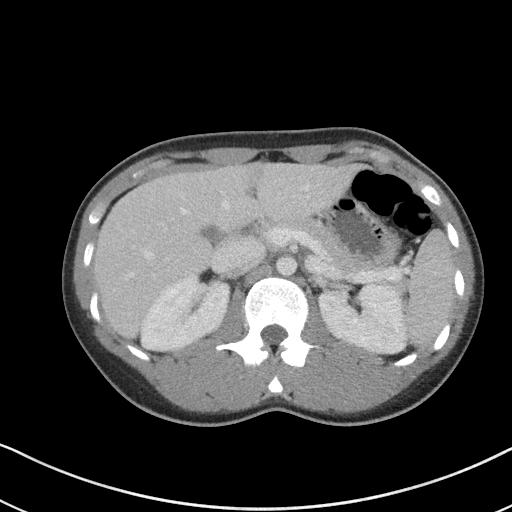
[im 74/86  soft-tissue]
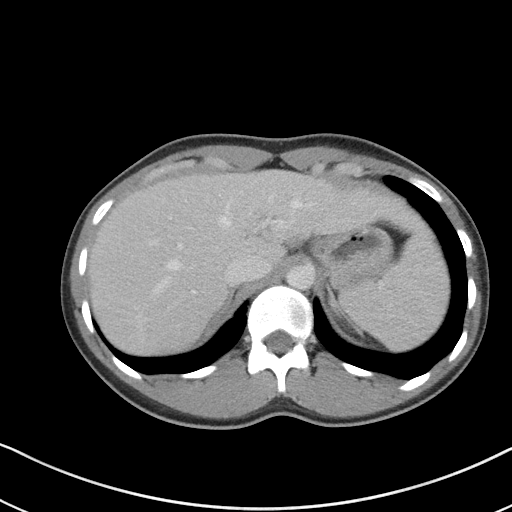
[im 80/86  soft-tissue]
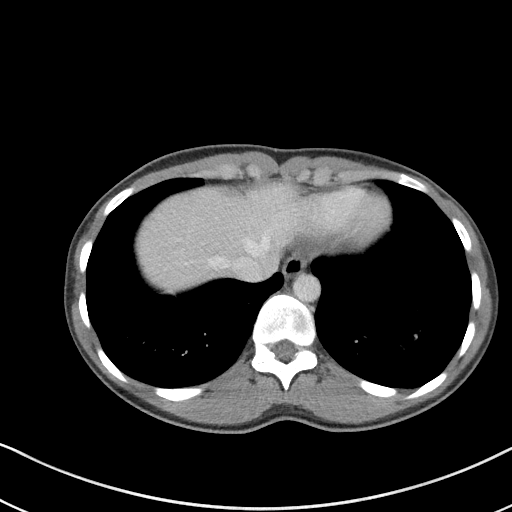

[Series 5: coronal st · coronal · 0.68mm/px · 3 of 70 slices shown]
[im 24/70  soft-tissue]
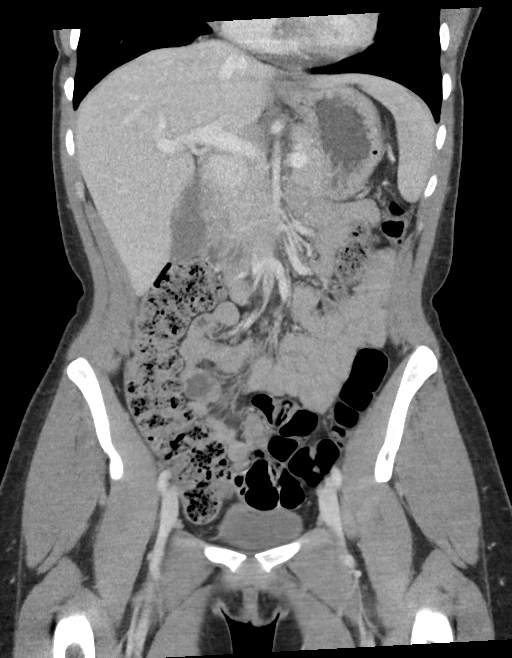
[im 31/70  soft-tissue]
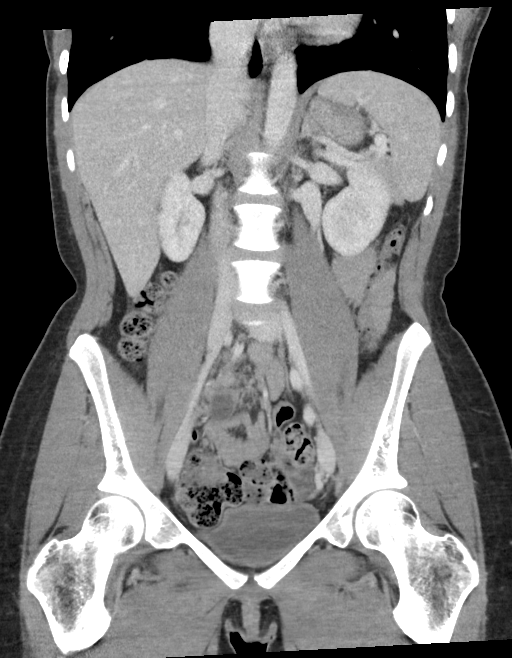
[im 39/70  soft-tissue]
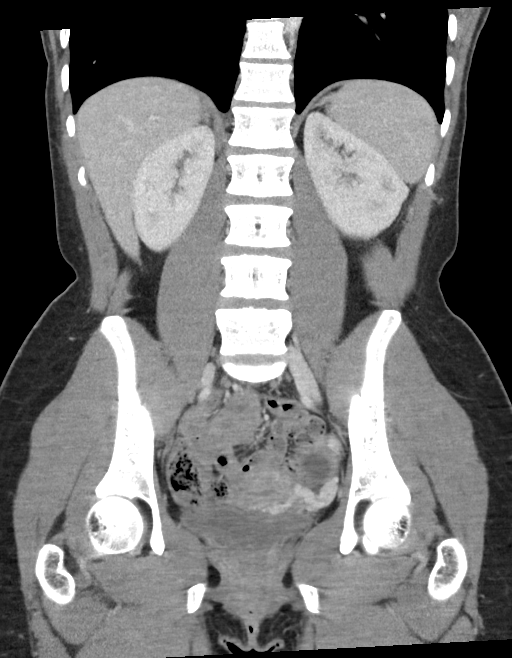

[15 of 46 positions shown; findings below may reference images not displayed]

FINDINGS: Lower chest: Lung bases are clear. Normal heart size. No pericardial
effusion.

Hepatobiliary: No focal liver abnormality is seen. No gallstones,
gallbladder wall thickening, or biliary dilatation.

Pancreas: Unremarkable. No pancreatic ductal dilatation or
surrounding inflammatory changes.

Spleen: Normal in size without focal abnormality.

Adrenals/Urinary Tract: Adrenal glands are unremarkable. Kidneys are
normal, without renal calculi, focal lesion, or hydronephrosis.
Bladder is unremarkable.

Stomach/Bowel: Evaluation of the bowel is somewhat complicated by a
paucity of intraperitoneal fat. Distal esophagus, stomach and
duodenal sweep are unremarkable. No small bowel wall thickening or
dilatation. No evidence of obstruction. Fecalization of the distal
small bowel contents without evidence of mechanical obstruction.
Normal appearing appendix is seen in a retrocecal appendix draped
across the right iliac vessels. No colonic dilatation or wall
thickening.

Vascular/Lymphatic: The aorta is normal caliber. There is focal
narrowing as the left renal vein passes posterior to the SMA which
demonstrates a markedly acute angle with the aorta (sagittal 6/60,
axial [DATE]). The distal left renal vein demonstrates a slightly
irregular patulous appearance. Additionally, there is notable
prominence of the gonadal vessels left greater than right worrisome
for pelvic congestion. No suspicious or enlarged lymph nodes in the
included lymphatic chains.

Reproductive: Prominent gonadal vessels as above with dilated
collaterals of the myometrium. Grossly normal follicle seen in both
ovaries. Ovarian size is symmetric. Small amount of air and debris
within the vaginal vault, nonspecific.

Other: No abdominopelvic free fluid or air. No bowel containing
hernia.

Musculoskeletal: No acute bony abnormality. Specifically, no
fracture, subluxation, or dislocation.
IMPRESSION: 1. Abrupt focal narrowing of the left renal vein as it passes
posterior to the acutely angled SMA suspicious for a nutcracker
syndrome. Dilated tortuous appearance of the distal renal vein
beyond the level of obstruction as well as marked distension of the
left gonadal vein and parametrial collaterals in the pelvis
concerning for a pelvic congestion syndrome and/or possible venous
shunting across the pelvis.
2. Fecalization of the distal small bowel contents without evidence
of mechanical obstruction. Such findings can be seen with slowed
intestinal transit/constipation.

These results were called by telephone at the time of interpretation
on 04/06/2020 at [DATE] to provider SHITALENI ELAO , who verbally
acknowledged these results.

## 2022-03-19 NOTE — Progress Notes (Signed)
? ?I,Brianna Ellis,acting as a scribe for [employer], PA-C.,have documented all relevant documentation on the behalf of Brianna Kirschner, PA-C,as directed by  Brianna Kirschner, PA-C while in the presence of Brianna Kirschner, PA-C. ? ?Acute Office Visit ? ?Subjective:  ? ? Patient ID: Brianna Ellis, female    DOB: [birth year], 21 y.o.   MRN: [id number] ? ?Cc. Abdominal pain, nausea, low back pain ? ?Brianna Ellis is a 20 y/o female who presents today with several complaints.  ?Reports lower abdominal pain, nausea w/o vomiting, bloating, low back pain, headache x few days. Reports both constipation and diarrhea. Reports her menstrual cycle is early, and not typical. Denies any form of birth control. Reports negative pregnancy test yesterday, but she is sexually active. Reports abnormal 'chunky' discharge before bleeding started. She has taking ibuprofen for the pain.  ? ?Past Surgical History:  ?Procedure Laterality Date  ? NO PAST SURGERIES    ? ? ?Family History  ?Problem Relation Age of Onset  ? Diabetes Mother   ? Hypertension Maternal Grandfather   ? ? ?Social History  ? ?Socioeconomic History  ? Marital status: Single  ?  Spouse name: Not on file  ? Number of children: Not on file  ? Years of education: Not on file  ? Highest education level: Not on file  ?Occupational History  ? Not on file  ?Tobacco Use  ? Smoking status: Never  ? Smokeless tobacco: Never  ?Vaping Use  ? Vaping Use: Every day  ? Start date: 12/21/2017  ? Substances: Nicotine, Flavoring  ?Substance and Sexual Activity  ? Alcohol use: Yes  ?  Comment: occasionaly  ? Drug use: Yes  ?  Types: Marijuana  ? Sexual activity: Yes  ?  Birth control/protection: None  ?Other Topics Concern  ? Not on file  ?Social History Narrative  ? Not on file  ? ?Social Determinants of Health  ? ?Financial Resource Strain: Not on file  ?Food Insecurity: Not on file  ?Transportation Needs: Not on file  ?Physical Activity: Not on file  ?Stress: Not on file  ?Social Connections:  Not on file  ?Intimate Partner Violence: Not on file  ? ? ?Outpatient Medications Prior to Visit  ?Medication Sig Dispense Refill  ? ibuprofen (ADVIL,MOTRIN) 800 MG tablet Take 1 tablet (800 mg total) by mouth every 8 (eight) hours as needed. 30 tablet 0  ? norethindrone-ethinyl estradiol (LOESTRIN) 1-20 MG-MCG tablet Aurovela 24 Fe 1 mg-20 mcg (24)/75 mg (4) tablet ? TAKE 1 TABLET BY MOUTH DAILY (Patient not taking: Reported on 03/20/2022)    ? polyethylene glycol powder (GLYCOLAX/MIRALAX) 17 GM/SCOOP powder Take 17 g by mouth 2 (two) times daily as needed. (Patient not taking: Reported on 03/20/2022) 3350 g 1  ? valACYclovir (VALTREX) 1000 MG tablet Take 2 tabs at onset of cold sore, then take take 1 tab PO BID x 5 days (Patient not taking: Reported on 03/20/2022) 20 tablet 5  ? ?No facility-administered medications prior to visit.  ? ? ?No Known Allergies ? ?Review of Systems  ?Constitutional:  Negative for fatigue and fever.  ?Respiratory:  Negative for cough and shortness of breath.   ?Cardiovascular:  Negative for chest pain and leg swelling.  ?Gastrointestinal:  Positive for abdominal distention, abdominal pain, constipation, diarrhea and nausea.  ?Genitourinary:  Positive for vaginal bleeding and vaginal discharge.  ?Neurological:  Positive for headaches. Negative for dizziness.  ? ?   ?Objective:  ?  ?Physical Exam ?Constitutional:   ?  General: She is awake.  ?   Appearance: She is well-developed.  ?HENT:  ?   Head: Normocephalic.  ?Eyes:  ?   Conjunctiva/sclera: Conjunctivae normal.  ?Cardiovascular:  ?   Rate and Rhythm: Normal rate and regular rhythm.  ?   Heart sounds: Normal heart sounds.  ?Pulmonary:  ?   Effort: Pulmonary effort is normal.  ?   Breath sounds: Normal breath sounds.  ?Abdominal:  ?   General: There is distension.  ?   Palpations: Abdomen is soft.  ?   Tenderness: There is abdominal tenderness in the suprapubic area. There is no right CVA tenderness, left CVA tenderness or guarding.   ?Skin: ?   General: Skin is warm.  ?Neurological:  ?   Mental Status: She is alert and oriented to person, place, and time.  ?Psychiatric:     ?   Attention and Perception: Attention normal.     ?   Mood and Affect: Mood normal.     ?   Speech: Speech normal.     ?   Behavior: Behavior is cooperative.  ? ? ?Blood pressure 122/84, pulse 90, height '5\' 8"'$  (1.727 m), weight 154 lb 4.8 oz (70 kg), SpO2 100 %.  ?Wt Readings from Last 3 Encounters:  ?03/20/22 154 lb 4.8 oz (70 kg)  ?03/12/21 152 lb 8 oz (69.2 kg)  ?10/14/20 142 lb (64.4 kg)  ? ?Urinalysis ?   ?Component Value Date/Time  ? COLORURINE YELLOW (A) 04/05/2020 2321  ? APPEARANCEUR HAZY (A) 04/05/2020 2321  ? LABSPEC 1.024 04/05/2020 2321  ? PHURINE 5.0 04/05/2020 2321  ? GLUCOSEU NEGATIVE 04/05/2020 2321  ? [place] NEGATIVE 04/05/2020 2321  ? BILIRUBINUR negative 03/20/2022 1645  ? [place] NEGATIVE 04/05/2020 2321  ? PROTEINUR Positive (A) 03/20/2022 1645  ? PROTEINUR NEGATIVE 04/05/2020 2321  ? UROBILINOGEN 0.2 03/20/2022 1645  ? NITRITE negative 03/20/2022 1645  ? NITRITE NEGATIVE 04/05/2020 2321  ? LEUKOCYTESUR Negative 03/20/2022 1645  ? LEUKOCYTESUR TRACE (A) 04/05/2020 2321  ? ?Assessment & Plan:  ? ?Problem List Items Addressed This Visit   ? ?  ? Other  ? Abdominal pain - Primary  ?  R/o pregnancy additional test negative today. ?G/C ordered d/t abnormal discharge, sexually active w/o protection. ?UA + blood, protein. Culture sent.  ?Rx bactrim podbid x 5 days for UTI suspicion.   ?Rx zofran for nausea.  ?Advised if symptoms do no improve on antibiotic or culture is negative, would recommend pelvic ultrasound.  ? ?  ?  ? Relevant Orders  ? POCT Urinalysis Dipstick (Completed)  ? GC Probe amplification, urine  ? POCT urine pregnancy (Completed)  ? ?Other Visit Diagnoses   ? ? Acute cystitis with hematuria      ? Relevant Medications  ? sulfamethoxazole-trimethoprim (BACTRIM DS) 800-160 MG tablet  ? Other Relevant Orders  ? Urine Culture  ? Nausea      ?  Relevant Medications  ? ondansetron (ZOFRAN) 4 MG tablet  ? Other Relevant Orders  ? POCT Urinalysis Dipstick (Completed)  ? GC Probe amplification, urine  ? POCT urine pregnancy (Completed)  ? Urinary frequency      ? Relevant Orders  ? POCT Urinalysis Dipstick (Completed)  ? GC Probe amplification, urine  ? Vaginal discharge      ? Relevant Orders  ? GC Probe amplification, urine  ? ?  ? Return if symptoms worsen or fail to improve.  ? ?I, Brianna Kirschner, PA-C have reviewed all documentation for  this visit. The documentation on  03/20/2022 for the exam, diagnosis, procedures, and orders are all accurate and complete. ? ?Brianna Kirschner, PA-C ?[place] ?[id number] ?[place], [place], [address] ?Office: [phone] ?Fax: [phone]  ? ?

## 2022-03-20 ENCOUNTER — Encounter: Payer: Self-pay | Admitting: Physician Assistant

## 2022-03-20 ENCOUNTER — Ambulatory Visit: Payer: Medicaid Other | Admitting: Physician Assistant

## 2022-03-20 VITALS — BP 122/84 | HR 90 | Ht 68.0 in | Wt 154.3 lb

## 2022-03-20 DIAGNOSIS — R110 Nausea: Secondary | ICD-10-CM | POA: Diagnosis not present

## 2022-03-20 DIAGNOSIS — R350 Frequency of micturition: Secondary | ICD-10-CM | POA: Diagnosis not present

## 2022-03-20 DIAGNOSIS — R1084 Generalized abdominal pain: Secondary | ICD-10-CM

## 2022-03-20 DIAGNOSIS — N3001 Acute cystitis with hematuria: Secondary | ICD-10-CM

## 2022-03-20 DIAGNOSIS — N898 Other specified noninflammatory disorders of vagina: Secondary | ICD-10-CM

## 2022-03-20 LAB — POCT URINALYSIS DIPSTICK
Bilirubin, UA: NEGATIVE
Glucose, UA: NEGATIVE
Ketones, UA: NEGATIVE
Leukocytes, UA: NEGATIVE
Nitrite, UA: NEGATIVE
Protein, UA: POSITIVE — AB
Spec Grav, UA: >=1.03 — AB (ref 1.010–1.025)
Urobilinogen, UA: 0.2 E.U./dL
pH, UA: 7 (ref 5.0–8.0)

## 2022-03-20 LAB — POCT URINE PREGNANCY: Preg Test, Ur: NEGATIVE

## 2022-03-20 MED ORDER — ONDANSETRON HCL 4 MG PO TABS: 4.0000 mg | ORAL_TABLET | Freq: Three times a day (TID) | ORAL | 0 refills | Status: DC | PRN

## 2022-03-20 MED ORDER — SULFAMETHOXAZOLE-TRIMETHOPRIM 800-160 MG PO TABS: 1.0000 | ORAL_TABLET | Freq: Two times a day (BID) | ORAL | 0 refills | Status: AC

## 2022-03-23 ENCOUNTER — Telehealth: Payer: Self-pay | Admitting: Physician Assistant

## 2022-03-23 NOTE — Telephone Encounter (Signed)
Jeannine Watlington calling from [facility] is calling to request the order to be changed GC Probe amplification, urine (Order [id number]). It was ordered wrong. ?CB- [phone] ?

## 2022-03-23 NOTE — Assessment & Plan Note (Signed)
R/o pregnancy additional test negative today. ?G/C ordered d/t abnormal discharge, sexually active w/o protection. ?UA + blood, protein. Culture sent.  ?Rx bactrim podbid x 5 days for UTI suspicion.   ?Rx zofran for nausea.  ?Advised if symptoms do no improve on antibiotic or culture is negative, would recommend pelvic ultrasound.  ? ?

## 2022-03-24 ENCOUNTER — Other Ambulatory Visit: Payer: Self-pay | Admitting: Physician Assistant

## 2022-03-24 ENCOUNTER — Other Ambulatory Visit (HOSPITAL_COMMUNITY)
Admission: RE | Admit: 2022-03-24 | Discharge: 2022-03-24 | Disposition: A | Discharge status: Home / Self Care | Payer: Medicaid Other | Source: Ambulatory Visit | Attending: Physician Assistant | Admitting: Physician Assistant

## 2022-03-24 ENCOUNTER — Other Ambulatory Visit: Payer: Self-pay

## 2022-03-24 DIAGNOSIS — R350 Frequency of micturition: Secondary | ICD-10-CM | POA: Insufficient documentation

## 2022-03-24 DIAGNOSIS — R110 Nausea: Secondary | ICD-10-CM

## 2022-03-24 DIAGNOSIS — N898 Other specified noninflammatory disorders of vagina: Secondary | ICD-10-CM | POA: Insufficient documentation

## 2022-03-24 DIAGNOSIS — R1084 Generalized abdominal pain: Secondary | ICD-10-CM

## 2022-03-24 DIAGNOSIS — N3001 Acute cystitis with hematuria: Secondary | ICD-10-CM

## 2022-03-25 LAB — CERVICOVAGINAL ANCILLARY ONLY
Chlamydia: NEGATIVE
Comment: NEGATIVE
Comment: NORMAL
Neisseria Gonorrhea: NEGATIVE

## 2022-03-30 LAB — SPECIMEN STATUS REPORT

## 2022-03-30 LAB — URINE CULTURE

## 2022-09-27 ENCOUNTER — Encounter: Payer: Self-pay | Admitting: Emergency Medicine

## 2022-09-27 ENCOUNTER — Emergency Department
Admission: EM | Admit: 2022-09-27 | Discharge: 2022-09-27 | Disposition: A | Discharge status: Home / Self Care | Payer: 59 | Attending: Emergency Medicine | Admitting: Emergency Medicine

## 2022-09-27 ENCOUNTER — Emergency Department: Payer: 59

## 2022-09-27 ENCOUNTER — Other Ambulatory Visit: Payer: Self-pay

## 2022-09-27 DIAGNOSIS — R079 Chest pain, unspecified: Secondary | ICD-10-CM | POA: Diagnosis not present

## 2022-09-27 DIAGNOSIS — K2970 Gastritis, unspecified, without bleeding: Secondary | ICD-10-CM | POA: Insufficient documentation

## 2022-09-27 DIAGNOSIS — R0789 Other chest pain: Secondary | ICD-10-CM | POA: Diagnosis not present

## 2022-09-27 DIAGNOSIS — R002 Palpitations: Secondary | ICD-10-CM | POA: Diagnosis not present

## 2022-09-27 LAB — BASIC METABOLIC PANEL
Anion gap: 7 (ref 5–15)
BUN: 11 mg/dL (ref 6–20)
CO2: 26 mmol/L (ref 22–32)
Calcium: 9.4 mg/dL (ref 8.9–10.3)
Chloride: 106 mmol/L (ref 98–111)
Creatinine, Ser: 0.83 mg/dL (ref 0.44–1.00)
GFR, Estimated: >60 mL/min (ref >60)
Glucose, Bld: 93 mg/dL (ref 70–99)
Potassium: 3.5 mmol/L (ref 3.5–5.1)
Sodium: 139 mmol/L (ref 135–145)

## 2022-09-27 LAB — TROPONIN I (HIGH SENSITIVITY)
Troponin I (High Sensitivity): 3 ng/L (ref <18)
Troponin I (High Sensitivity): <2 ng/L (ref <18)

## 2022-09-27 LAB — URINALYSIS, ROUTINE W REFLEX MICROSCOPIC
Bilirubin Urine: NEGATIVE
Glucose, UA: NEGATIVE mg/dL
Hgb urine dipstick: NEGATIVE
Ketones, ur: NEGATIVE mg/dL
Nitrite: NEGATIVE
Protein, ur: 30 mg/dL — AB
Specific Gravity, Urine: 1.029 (ref 1.005–1.030)
pH: 5 (ref 5.0–8.0)

## 2022-09-27 LAB — CBC
HCT: 37.1 % (ref 36.0–46.0)
Hemoglobin: 12.3 g/dL (ref 12.0–15.0)
MCH: 29.2 pg (ref 26.0–34.0)
MCHC: 33.2 g/dL (ref 30.0–36.0)
MCV: 88.1 fL (ref 80.0–100.0)
Platelets: 206 10*3/uL (ref 150–400)
RBC: 4.21 MIL/uL (ref 3.87–5.11)
RDW: 11.6 % (ref 11.5–15.5)
WBC: 4.5 10*3/uL (ref 4.0–10.5)
nRBC: 0 % (ref 0.0–0.2)

## 2022-09-27 LAB — POC URINE PREG, ED: Preg Test, Ur: NEGATIVE

## 2022-09-27 LAB — D-DIMER, QUANTITATIVE: D-Dimer, Quant: 0.4 ug/mL-FEU (ref 0.00–0.50)

## 2022-09-27 LAB — TSH: TSH: 2.215 u[IU]/mL (ref 0.350–4.500)

## 2022-09-27 MED ORDER — PANTOPRAZOLE SODIUM 40 MG PO TBEC: 40.0000 mg | DELAYED_RELEASE_TABLET | Freq: Every day | ORAL | 1 refills | Status: DC

## 2022-09-27 MED ORDER — ALUM & MAG HYDROXIDE-SIMETH 200-200-20 MG/5ML PO SUSP
30.0000 mL | Freq: Once | ORAL | Status: AC
  Administered 2022-09-27: 30 mL via ORAL
  Filled 2022-09-27: qty 30

## 2022-09-27 MED ORDER — HYDROXYZINE HCL 25 MG PO TABS: 25.0000 mg | ORAL_TABLET | Freq: Three times a day (TID) | ORAL | 0 refills | Status: AC | PRN

## 2022-09-27 MED ORDER — LIDOCAINE VISCOUS HCL 2 % MT SOLN
15.0000 mL | Freq: Once | OROMUCOSAL | Status: AC
  Administered 2022-09-27: 15 mL via ORAL
  Filled 2022-09-27: qty 15

## 2022-09-27 NOTE — ED Triage Notes (Signed)
Pt repots Thursday started with some cp that was heavy in nature and radiates into her left arm. Pt reports happened after a stress response at work but continues to have the pain and sometimes feels really SOB. Pt also reports some SOB with walking short distances.

## 2022-09-27 NOTE — ED Provider Notes (Signed)
[facility] Provider Note    Event Date/Time   First MD Initiated Contact with Patient 09/27/22 1948     (approximate)   History   Tachycardia and Chest Pain   HPI  Brianna Ellis is a 21 y.o. female   who presents to the emergency department today because of concerns for chest discomfort.  Symptoms started a couple of days ago.  Located in the center part of her chest.  Described as burning.  There is some radiation of the left side of her neck.  Has had some shortness of breath.  Denies any cough.  Symptoms started when the patient was at work and under a lot of stress.  She denies similar symptoms in the past.  Denies any extremity swelling.   Physical Exam   Triage Vital Signs: ED Triage Vitals  Enc Vitals Group     BP 09/27/22 1207 124/85     Pulse Rate 09/27/22 1207 87     Resp 09/27/22 1207 14     Temp 09/27/22 1207 98.5 F (36.9 C)     Temp Source 09/27/22 1748 Oral     SpO2 09/27/22 1207 100 %     Weight 09/27/22 1159 154 lb 5.2 oz (70 kg)     Height 09/27/22 1159 '5\' 8"'$  (1.727 m)     Head Circumference --      Peak Flow --      Pain Score 09/27/22 1159 6     Pain Loc --      Pain Edu? --      Excl. in [place]? --     Most recent vital signs: Vitals:   09/27/22 1207 09/27/22 1748  BP: 124/85 121/77  Pulse: 87 83  Resp: 14 16  Temp: 98.5 F (36.9 C) 98.1 F (36.7 C)  SpO2: 100% 100%   General: Awake, alert, oriented. CV:  Good peripheral perfusion. Regular rate and rhythm. Resp:  Normal effort. Lungs clear. Abd:  No distention. Non tender. Other:  No lower extremity edema.    ED Results / Procedures / Treatments   Labs (all labs ordered are listed, but only abnormal results are displayed) Labs Reviewed  URINALYSIS, ROUTINE W REFLEX MICROSCOPIC - Abnormal; Notable for the following components:      Result Value   Color, Urine YELLOW (*)    APPearance HAZY (*)    Protein, ur 30 (*)    Leukocytes,Ua MODERATE (*)     Bacteria, UA MANY (*)    All other components within normal limits  BASIC METABOLIC PANEL  CBC  D-DIMER, QUANTITATIVE  TSH  POC URINE PREG, ED  TROPONIN I (HIGH SENSITIVITY)  TROPONIN I (HIGH SENSITIVITY)     EKG  I, Nance Pear, attending physician, personally viewed and interpreted this EKG  EKG Time: 1204 Rate: 92 Rhythm: normal sinus rhythm Axis: rightward axis  Intervals: qtc 432 QRS: narrow ST changes: no st elevation Impression: abnormal ekg   RADIOLOGY I independently interpreted and visualized the CXR. My interpretation: No pneumonia. Radiology interpretation:  IMPRESSION:  No acute cardiopulmonary disease.      PROCEDURES:  Critical Care performed: No  Procedures   MEDICATIONS ORDERED IN ED: Medications - No data to display   IMPRESSION / MDM / [place] / ED COURSE  I reviewed the triage vital signs and the nursing notes.  Differential diagnosis includes, but is not limited to, pneumonia, ACS, PE, pneumothorax, esophagitis.  Patient's presentation is most consistent with acute presentation with potential threat to life or bodily function.  Patient presented to the emergency department today because of concerns for chest pain described as burning.  Patient's blood work here is reassuring for negative troponin and D-dimer.  Chest x-ray without concerning pneumonia or pneumothorax.  Given patient's description of burning did try GI cocktail. Patient did feel relief after GI cocktail. At this time I think likely gastritis. Discussed this with the patient. Will plan on discharging with antacid and dietary guideline. Will also give prescription for atarax for stress and anxiety.   FINAL CLINICAL IMPRESSION(S) / ED DIAGNOSES   Final diagnoses:  Gastritis, presence of bleeding unspecified, unspecified chronicity, unspecified gastritis type     Note:  This document was prepared using Dragon voice recognition  software and may include unintentional dictation errors.    Nance Pear, MD 09/27/22 2137

## 2022-09-27 NOTE — ED Provider Triage Note (Signed)
Emergency Medicine Provider Triage Evaluation Note  Brianna Ellis , a 20 y.o. female  was evaluated in triage.  Pt complains of palpitations and chest pain a couple of nights ago. Patient reports that she had a stressful event at work when she felt these symptoms. Felt better yesterday day and then yesterday evening she developed sharp left sided chest pain. Worse with palpation, not with deep inspiration. Radiates to her left arm. No back pain. Reports "pinching" in her left lower leg. No swelling. No hormones/OCPs. Reports "winded" when walking up stairs recently which is unusual for her.  Reports smoking/vaping. LMP 9/17. No recent sick symptoms.  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  Appropriate orders placed.  KENSI KARR was informed that the remainder of the evaluation will be completed by another provider, this initial triage assessment does not replace that evaluation, and the importance of remaining in the ED until their evaluation is complete.     Marquette Old, PA-C 09/27/22 1207

## 2022-09-27 NOTE — Discharge Instructions (Signed)
Please seek medical attention for any high fevers, chest pain, shortness of breath, change in behavior, persistent vomiting, bloody stool or any other new or concerning symptoms.  

## 2022-10-19 ENCOUNTER — Encounter (INDEPENDENT_AMBULATORY_CARE_PROVIDER_SITE_OTHER): Payer: Self-pay

## 2023-03-03 ENCOUNTER — Other Ambulatory Visit (HOSPITAL_COMMUNITY)
Admission: RE | Admit: 2023-03-03 | Discharge: 2023-03-03 | Disposition: A | Discharge status: Home / Self Care | Payer: 59 | Source: Ambulatory Visit | Attending: Physician Assistant | Admitting: Physician Assistant

## 2023-03-03 ENCOUNTER — Encounter: Payer: Self-pay | Admitting: Physician Assistant

## 2023-03-03 ENCOUNTER — Ambulatory Visit (INDEPENDENT_AMBULATORY_CARE_PROVIDER_SITE_OTHER): Payer: 59 | Admitting: Physician Assistant

## 2023-03-03 VITALS — BP 110/74 | HR 97 | Ht 68.0 in | Wt 163.0 lb

## 2023-03-03 DIAGNOSIS — N949 Unspecified condition associated with female genital organs and menstrual cycle: Secondary | ICD-10-CM | POA: Diagnosis not present

## 2023-03-03 DIAGNOSIS — N6311 Unspecified lump in the right breast, upper outer quadrant: Secondary | ICD-10-CM | POA: Diagnosis not present

## 2023-03-03 DIAGNOSIS — Z114 Encounter for screening for human immunodeficiency virus [HIV]: Secondary | ICD-10-CM | POA: Diagnosis not present

## 2023-03-03 DIAGNOSIS — R5383 Other fatigue: Secondary | ICD-10-CM

## 2023-03-03 DIAGNOSIS — Z113 Encounter for screening for infections with a predominantly sexual mode of transmission: Secondary | ICD-10-CM

## 2023-03-03 DIAGNOSIS — Z1159 Encounter for screening for other viral diseases: Secondary | ICD-10-CM | POA: Diagnosis not present

## 2023-03-03 DIAGNOSIS — Z124 Encounter for screening for malignant neoplasm of cervix: Secondary | ICD-10-CM | POA: Insufficient documentation

## 2023-03-03 DIAGNOSIS — Z0000 Encounter for general adult medical examination without abnormal findings: Secondary | ICD-10-CM | POA: Diagnosis not present

## 2023-03-03 NOTE — Progress Notes (Signed)
I,Sha'taria Tyson,acting as a Education administrator for [employer], PA-C.,have documented all relevant documentation on the behalf of Mikey Kirschner, PA-C,as directed by  Mikey Kirschner, PA-C while in the presence of Mikey Kirschner, PA-C.   Complete physical exam   Patient: Brianna Ellis   DOB: [birth year]   22 y.o. Female  MRN: [id number] Visit Date: 03/03/2023  Today's healthcare provider: Mikey Kirschner, PA-C   Cc. cpe  Subjective    Brianna Ellis is a 22 y.o. female who presents today for a complete physical exam.  She reports consuming a general diet.  The patient reports going to the gym three days a week for at least a hour.  She generally feels poorly. She reports sleeping well. She does not have additional problems to discuss today.   For the last 3-7 days she reports having headaches, fatigue. She was treated for an ear infection a few weeks ago, but reports not finishing the antibiotic. Reports her ears feel full and her headaches are at the front of her head. Reports sleeping longer.  Pt reports during intercourse having some initial vaginal discomfort, similar to a 'tearing' sensation. Denies vaginal discharge out of the normal for her.  History reviewed. No pertinent past medical history. Past Surgical History:  Procedure Laterality Date   NO PAST SURGERIES     Social History   Socioeconomic History   Marital status: Single    Spouse name: Not on file   Number of children: Not on file   Years of education: Not on file   Highest education level: Not on file  Occupational History   Not on file  Tobacco Use   Smoking status: Never   Smokeless tobacco: Never  Vaping Use   Vaping Use: Every day   Start date: 12/21/2017   Substances: Nicotine, Flavoring  Substance and Sexual Activity   Alcohol use: Yes    Comment: occasionaly   Drug use: Yes    Types: Marijuana   Sexual activity: Yes    Birth control/protection: None  Other Topics Concern   Not on file  Social  History Narrative   Not on file   Social Determinants of Health   Financial Resource Strain: Not on file  Food Insecurity: Not on file  Transportation Needs: Not on file  Physical Activity: Not on file  Stress: Not on file  Social Connections: Not on file  Intimate Partner Violence: Not on file   Family Status  Relation Name Status   Mother  Alive   Father  Alive   Brother  Alive   MGF  (Not Specified)   Family History  Problem Relation Age of Onset   Diabetes Mother    Hypertension Maternal Grandfather    No Known Allergies  Patient Care Team: Mikey Kirschner, PA-C as PCP - General (Physician Assistant)   Medications: Outpatient Medications Prior to Visit  Medication Sig   hydrOXYzine (ATARAX) 25 MG tablet Take 1 tablet (25 mg total) by mouth 3 (three) times daily as needed for anxiety.   ibuprofen (ADVIL,MOTRIN) 800 MG tablet Take 1 tablet (800 mg total) by mouth every 8 (eight) hours as needed.   [DISCONTINUED] amoxicillin (AMOXIL) 875 MG tablet Take 875 mg by mouth 2 (two) times daily. (Patient not taking: Reported on 03/03/2023)   [DISCONTINUED] lidocaine (XYLOCAINE) 2 % solution SMARTSIG:15 Milliliter(s) By Mouth Every 3 Hours (Patient not taking: Reported on 03/03/2023)   [DISCONTINUED] ondansetron (ZOFRAN) 4 MG tablet Take 1 tablet (4 mg total)  by mouth every 8 (eight) hours as needed for nausea or vomiting. (Patient not taking: Reported on 03/03/2023)   [DISCONTINUED] ondansetron (ZOFRAN-ODT) 4 MG disintegrating tablet Take 4 mg by mouth 3 (three) times daily. (Patient not taking: Reported on 03/03/2023)   [DISCONTINUED] pantoprazole (PROTONIX) 40 MG tablet Take 1 tablet (40 mg total) by mouth daily. (Patient not taking: Reported on 03/03/2023)   No facility-administered medications prior to visit.    Review of Systems  Constitutional:  Positive for fatigue.  Respiratory:  Positive for shortness of breath.   Gastrointestinal:  Positive for abdominal distention and  nausea.  Neurological:  Positive for headaches.  Psychiatric/Behavioral:  The patient is nervous/anxious.     Objective    BP 110/74 (BP Location: Right Arm, Patient Position: Sitting, Cuff Size: Normal)   Pulse 97   Ht '5\' 8"'$  (1.727 m)   Wt 163 lb (73.9 kg)   LMP 02/19/2023   SpO2 100%   BMI 24.78 kg/m     Physical Exam Constitutional:      General: She is awake.     Appearance: She is well-developed. She is not ill-appearing.  HENT:     Head: Normocephalic.     Right Ear: Tympanic membrane normal.     Left Ear: Tympanic membrane normal.     Nose: Nose normal. No congestion or rhinorrhea.     Mouth/Throat:     Pharynx: No oropharyngeal exudate or posterior oropharyngeal erythema.  Eyes:     Conjunctiva/sclera: Conjunctivae normal.     Pupils: Pupils are equal, round, and reactive to light.  Neck:     Thyroid: No thyroid mass or thyromegaly.  Cardiovascular:     Rate and Rhythm: Normal rate and regular rhythm.     Heart sounds: Normal heart sounds.  Pulmonary:     Effort: Pulmonary effort is normal.     Breath sounds: Normal breath sounds.  Chest:     Comments: Right breast with density vs mass 9:00 4-5 cmfn. Left breast with similar density but feels more benign and less discrete.  Otherwise no skin changes, nipple discharge, nipple inversion Abdominal:     Palpations: Abdomen is soft.     Tenderness: There is no abdominal tenderness.  Genitourinary:    Labia:        Right: No rash or lesion.        Left: No rash or lesion.      Vagina: Normal.     Cervix: Normal.     Uterus: Normal.      Adnexa: Right adnexa normal.  Musculoskeletal:     Right lower leg: No swelling. No edema.     Left lower leg: No swelling. No edema.  Lymphadenopathy:     Cervical: No cervical adenopathy.  Skin:    General: Skin is warm.  Neurological:     Mental Status: She is alert and oriented to person, place, and time.  Psychiatric:        Attention and Perception: Attention  normal.        Mood and Affect: Mood normal.        Speech: Speech normal.        Behavior: Behavior normal. Behavior is cooperative.      Last depression screening scores    03/03/2023    1:28 PM 03/20/2022    3:37 PM 03/12/2021    9:05 AM  PHQ 2/9 Scores  PHQ - 2 Score 1 1 0  PHQ- 9 Score 3 5  3   Last fall risk screening    03/03/2023    1:27 PM  [place] in the past year? 0  Number falls in past yr: 0  Injury with Fall? 0  Risk for fall due to : No Fall Risks  Follow up Falls evaluation completed   Last Audit-C alcohol use screening    03/03/2023    1:27 PM  Alcohol Use Disorder Test (AUDIT)  1. How often do you have a drink containing alcohol? 1  2. How many drinks containing alcohol do you have on a typical day when you are drinking? 0  3. How often do you have six or more drinks on one occasion? 1  AUDIT-C Score 2   A score of 3 or more in women, and 4 or more in men indicates increased risk for alcohol abuse, EXCEPT if all of the points are from question 1   No results found for any visits on 03/03/23.  Assessment & Plan    Routine Health Maintenance and Physical Exam  Exercise Activities and Dietary recommendations --balanced diet high in fiber and protein, low in sugars, carbs, fats. --physical activity/exercise 30 minutes 3-5 times a week     Immunization History  Administered Date(s) Administered   DTaP 02/19/2000, 04/23/2000, 06/21/2000, 07/28/2002, 03/28/2004   HIB (PRP-OMP) 02/19/2000, 04/23/2000, 06/21/2000, 01/29/2003   HPV 9-valent 08/23/2015   HPV Quadrivalent 08/11/2013   Hepatitis A 08/10/2011, 08/09/2012   Hepatitis A, Ped/Adol-2 Dose 08/10/2011, 08/09/2012   Hepatitis B 02/04/00, 06/21/2000, 01/10/2001   IPV 02/19/2000, 04/23/2000, 01/10/2001, 03/28/2004   Influenza-Unspecified 10/24/2004   Janssen (J&J) SARS-COV-2 Vaccination 03/27/2020   MMR 01/10/2001, 03/28/2004   Meningococcal B, OMV 08/23/2015, 09/03/2016    Meningococcal Conjugate 08/23/2015, 09/03/2016   Pneumococcal-Unspecified 02/19/2000, 04/23/2000, 06/21/2000   Tdap 08/10/2011   Varicella 01/10/2001, 08/10/2011    Health Maintenance  Topic Date Due   Hepatitis C Screening  Never done   PAP-Cervical Cytology Screening  Never done   PAP SMEAR-Modifier  Never done   DTaP/Tdap/Td (7 - Td or Tdap) 08/09/2021   COVID-19 Vaccine (2 - 2023-24 season) 08/21/2022   INFLUENZA VACCINE  03/21/2023 (Originally 07/21/2022)   CHLAMYDIA SCREENING  03/25/2023   HPV VACCINES  Completed   HIV Screening  Completed    Discussed health benefits of physical activity, and encouraged her to engage in regular exercise appropriate for her age and condition.  Problem List Items Addressed This Visit       Other   Other fatigue    May be residual from recent uri/ear infection Exam appears normal  Recommending antihistamine daily       Relevant Orders   Epstein-Barr virus VCA, IgM   Vitamin D (25 hydroxy)   Vaginal discomfort    Advised may need lubrication during intercourse Will check bv, yeast. Also doing sti testing      Relevant Orders   Cervicovaginal ancillary only   Mass of upper outer quadrant of right breast    Nearly symmetrical but right side is more pronounced and mass-like She reports feeling this previously Ordered targeted ultrasound      Relevant Orders   [place] LIMITED ULTRASOUND INCLUDING AXILLA RIGHT BREAST   Other Visit Diagnoses     Annual physical exam    -  Primary   Relevant Orders   Lipid Profile   Comprehensive Metabolic Panel (CMET)   Encounter for screening for HIV       Relevant Orders  HIV antibody (with reflex)   Encounter for hepatitis C screening test for low risk patient       Relevant Orders   Hepatitis C Antibody   Routine screening for STI (sexually transmitted infection)       Relevant Orders   HIV antibody (with reflex)   Hepatitis C Antibody   RPR   Cervical cancer screening       Relevant  Orders   Cytology - PAP       Return in about 1 year (around 03/02/2024) for CPE.     I, Mikey Kirschner, PA-C have reviewed all documentation for this visit. The documentation on  03/03/23  for the exam, diagnosis, procedures, and orders are all accurate and complete.  Mikey Kirschner, PA-C [facility] [address] [place], [place], [address] Office: [phone] Fax: [place]

## 2023-03-03 NOTE — Assessment & Plan Note (Signed)
May be residual from recent uri/ear infection Exam appears normal  Recommending antihistamine daily

## 2023-03-03 NOTE — Assessment & Plan Note (Signed)
Nearly symmetrical but right side is more pronounced and mass-like She reports feeling this previously Ordered targeted ultrasound

## 2023-03-03 NOTE — Assessment & Plan Note (Signed)
Advised may need lubrication during intercourse Will check bv, yeast. Also doing sti testing

## 2023-03-05 ENCOUNTER — Other Ambulatory Visit: Payer: Self-pay | Admitting: Physician Assistant

## 2023-03-05 ENCOUNTER — Encounter: Payer: Self-pay | Admitting: Physician Assistant

## 2023-03-05 DIAGNOSIS — Z113 Encounter for screening for infections with a predominantly sexual mode of transmission: Secondary | ICD-10-CM | POA: Diagnosis not present

## 2023-03-05 DIAGNOSIS — B3731 Acute candidiasis of vulva and vagina: Secondary | ICD-10-CM

## 2023-03-05 DIAGNOSIS — Z1159 Encounter for screening for other viral diseases: Secondary | ICD-10-CM | POA: Diagnosis not present

## 2023-03-05 DIAGNOSIS — R5383 Other fatigue: Secondary | ICD-10-CM | POA: Diagnosis not present

## 2023-03-05 DIAGNOSIS — Z114 Encounter for screening for human immunodeficiency virus [HIV]: Secondary | ICD-10-CM | POA: Diagnosis not present

## 2023-03-05 DIAGNOSIS — Z0000 Encounter for general adult medical examination without abnormal findings: Secondary | ICD-10-CM | POA: Diagnosis not present

## 2023-03-05 LAB — CERVICOVAGINAL ANCILLARY ONLY
Bacterial Vaginitis (gardnerella): NEGATIVE
Candida Glabrata: NEGATIVE
Candida Vaginitis: POSITIVE — AB
Chlamydia: NEGATIVE
Comment: NEGATIVE
Comment: NEGATIVE
Comment: NEGATIVE
Comment: NEGATIVE
Comment: NEGATIVE
Comment: NORMAL
Neisseria Gonorrhea: NEGATIVE
Trichomonas: NEGATIVE

## 2023-03-05 MED ORDER — FLUCONAZOLE 150 MG PO TABS: 150.0000 mg | ORAL_TABLET | Freq: Once | ORAL | 0 refills | Status: AC

## 2023-03-08 ENCOUNTER — Other Ambulatory Visit: Payer: Self-pay | Admitting: Physician Assistant

## 2023-03-08 DIAGNOSIS — E559 Vitamin D deficiency, unspecified: Secondary | ICD-10-CM

## 2023-03-08 LAB — COMPREHENSIVE METABOLIC PANEL
ALT: 11 IU/L (ref 0–32)
AST: 17 IU/L (ref 0–40)
Albumin/Globulin Ratio: 1.6 (ref 1.2–2.2)
Albumin: 4.5 g/dL (ref 4.0–5.0)
Alkaline Phosphatase: 53 IU/L (ref 44–121)
BUN/Creatinine Ratio: 12 (ref 9–23)
BUN: 9 mg/dL (ref 6–20)
Bilirubin Total: 0.5 mg/dL (ref 0.0–1.2)
CO2: 23 mmol/L (ref 20–29)
Calcium: 9.6 mg/dL (ref 8.7–10.2)
Chloride: 104 mmol/L (ref 96–106)
Creatinine, Ser: 0.78 mg/dL (ref 0.57–1.00)
Globulin, Total: 2.9 g/dL (ref 1.5–4.5)
Glucose: 86 mg/dL (ref 70–99)
Potassium: 4.3 mmol/L (ref 3.5–5.2)
Sodium: 140 mmol/L (ref 134–144)
Total Protein: 7.4 g/dL (ref 6.0–8.5)
eGFR: 109 mL/min/{1.73_m2} (ref >59)

## 2023-03-08 LAB — LIPID PANEL
Chol/HDL Ratio: 2.3 ratio (ref 0.0–4.4)
Cholesterol, Total: 130 mg/dL (ref 100–199)
HDL: 56 mg/dL (ref >39)
LDL Chol Calc (NIH): 66 mg/dL (ref 0–99)
Triglycerides: 28 mg/dL (ref 0–149)
VLDL Cholesterol Cal: 8 mg/dL (ref 5–40)

## 2023-03-08 LAB — RPR: RPR Ser Ql: NONREACTIVE

## 2023-03-08 LAB — VITAMIN D 25 HYDROXY (VIT D DEFICIENCY, FRACTURES): Vit D, 25-Hydroxy: 8.8 ng/mL — ABNORMAL LOW (ref 30.0–100.0)

## 2023-03-08 LAB — HEPATITIS C ANTIBODY: Hep C Virus Ab: NONREACTIVE

## 2023-03-08 LAB — HIV ANTIBODY (ROUTINE TESTING W REFLEX): HIV Screen 4th Generation wRfx: NONREACTIVE

## 2023-03-08 LAB — EPSTEIN-BARR VIRUS VCA, IGM: EBV VCA IgM: <36 U/mL (ref 0.0–35.9)

## 2023-03-08 LAB — CYTOLOGY - PAP: Diagnosis: NEGATIVE

## 2023-03-08 MED ORDER — CHOLECALCIFEROL 1.25 MG (50000 UT) PO TABS: 1.2500 mg | ORAL_TABLET | ORAL | 0 refills | Status: AC

## 2023-03-09 ENCOUNTER — Ambulatory Visit
Admission: RE | Admit: 2023-03-09 | Discharge: 2023-03-09 | Disposition: A | Discharge status: Home / Self Care | Payer: 59 | Source: Ambulatory Visit | Attending: Physician Assistant | Admitting: Physician Assistant

## 2023-03-09 DIAGNOSIS — N6489 Other specified disorders of breast: Secondary | ICD-10-CM | POA: Diagnosis not present

## 2023-03-09 DIAGNOSIS — N6311 Unspecified lump in the right breast, upper outer quadrant: Secondary | ICD-10-CM

## 2023-06-22 ENCOUNTER — Ambulatory Visit: Payer: Self-pay

## 2023-06-22 NOTE — Telephone Encounter (Signed)
  Chief Complaint: Abdominal swelling, 10 lb weight gain in 1 week. Symptoms: above Frequency: 2 weeks Pertinent Negatives: Patient denies  Disposition: [] ED /[x] Urgent Care (no appt availability in office) / [x] Appointment(In office/virtual)/ []  [facility]/ [] Home Care/ [] Refused Recommended Disposition /[]  Mobile Bus/ [x]  Follow-up with PCP Additional Notes: Pt called - she has had  10 lb weight gain in the past 1-2 weeks. Pt states that her abdomen is quite distended as well. Pt reports small amount of intermittent pain. Pt noticed facial fullness as well. Pt has not taken a pregnancy test, but pregnancy is a possibility. Pt states her period is very regular and her period came a few weeks early and was only 3 days. Pt will take an at home pregnancy test. Pt called UC prior to calling NT, UC told pt that they would not be able to help her and to call PCP.  Scheduled earliest appt available.  PT would like to be worked in if possible as she should be seen much sooner  than scheduled appt. Pt will monitor s/s and will go to ED if needed. Please work in pt if possible.   Reason for Disposition  [1] MODERATE-SEVERE SWELLING of abdomen (e.g., looks very distended or swollen) AND [2] NEW-onset or much worse  Answer Assessment - Initial Assessment Questions 1. SYMPTOM: "What's the main symptom you're concerned about?" (e.g., abdomen bloating, swelling)     Bloating - 10 lbs in a week. 2. ONSET: "When did bloating  start?"     1-2 weeks 3. SEVERITY: "How bad is the bloating or swelling?"    - BLOATING: Feels gassy or bloated. No visible swelling.     - MILD SWELLING: Feels gassy or bloated. Abdomen looks mildly distended or swollen.    - MODERATE - SEVERE SWELLING: Abdomen looks very distended or swollen.      severe 4. ABDOMEN PAIN:  "Is there any abdomen pain?" If Yes, ask: "How bad is the pain?"  (e.g., Scale 1-10; mild, moderate, or severe)   - NONE (0): No pain.    - MILD (1-3): Doesn't interfere with normal activities, abdomen soft and not tender to touch.    - MODERATE (4-7): Interferes with normal activities or awakens from sleep, abdomen tender to touch.    - SEVERE (8-10): Excruciating pain, doubled over, unable to do any normal activities.       No - slight pain - 5/10 occasionally - back aching 5. RELIEVING AND AGGRAVATING FACTORS: "What makes it better or worse?" (e.g., certain foods, lactose, medicines)     Heating pad usually makes back feel better. 6. GI HISTORY: "Do you have any history of stomach or intestine problems?" (e.g., bowel obstruction, cancer, irritable bowel)      no 7. CAUSE: "What do you think is causing the bloating?"      unclear 8. OTHER SYMPTOMS: "Do you have any other symptoms?" (e.g., belching, blood in stool, breathing difficulty, constipation, diarrhea, fever, passing gas, vomiting, weight loss, white of eyes have turned yellow)     Period started early - only lasted 3 days 9. PREGNANCY: "Is there any chance you are pregnant?" "When was your last menstrual period?"     Possible - period is very regular - period came early and pt had bleeding for only 3 days.  Protocols used: Abdomen Bloating and Swelling-A-AH

## 2023-06-29 ENCOUNTER — Encounter: Payer: Self-pay | Admitting: Physician Assistant

## 2023-06-29 ENCOUNTER — Ambulatory Visit (INDEPENDENT_AMBULATORY_CARE_PROVIDER_SITE_OTHER): Payer: 59 | Admitting: Physician Assistant

## 2023-06-29 VITALS — BP 99/65 | HR 91 | Ht 68.0 in | Wt 166.6 lb

## 2023-06-29 DIAGNOSIS — R519 Headache, unspecified: Secondary | ICD-10-CM

## 2023-06-29 DIAGNOSIS — N926 Irregular menstruation, unspecified: Secondary | ICD-10-CM

## 2023-06-29 DIAGNOSIS — R1030 Lower abdominal pain, unspecified: Secondary | ICD-10-CM

## 2023-06-29 DIAGNOSIS — E559 Vitamin D deficiency, unspecified: Secondary | ICD-10-CM

## 2023-06-29 DIAGNOSIS — R140 Abdominal distension (gaseous): Secondary | ICD-10-CM | POA: Diagnosis not present

## 2023-06-29 LAB — POCT URINALYSIS DIPSTICK
Bilirubin, UA: NEGATIVE
Blood, UA: NEGATIVE
Glucose, UA: NEGATIVE
Ketones, UA: NEGATIVE
Leukocytes, UA: NEGATIVE
Nitrite, UA: NEGATIVE
Protein, UA: NEGATIVE
Spec Grav, UA: 1.015 (ref 1.010–1.025)
Urobilinogen, UA: 0.2 E.U./dL
pH, UA: 6.5 (ref 5.0–8.0)

## 2023-06-29 LAB — POCT URINE PREGNANCY: Preg Test, Ur: NEGATIVE

## 2023-06-29 MED ORDER — LORATADINE 10 MG PO TABS: 10.0000 mg | ORAL_TABLET | Freq: Every day | ORAL | 1 refills | Status: AC

## 2023-06-29 MED ORDER — FLUTICASONE PROPIONATE 50 MCG/ACT NA SUSP: 2.0000 | Freq: Every day | NASAL | 6 refills | Status: DC

## 2023-06-29 NOTE — Progress Notes (Signed)
Established patient visit   Patient: Brianna Ellis   DOB: 2001/10/08   22 y.o. Female  MRN: [id number] Visit Date: 06/29/2023  Today's healthcare provider: Alfredia Ferguson, PA-C   Cc. Bloating, irregular periods  Subjective    HPI Discussed the use of AI scribe software for clinical note transcription with the patient, who gave verbal consent to proceed.  History of Present Illness   The patient presents with abdominal bloating and discomfort, which started after an early and heavy menstrual period x 2 weeks ago. The period was unusual, starting a week early and was heavier than usual it then lightened to a light pinkish color, turned brown, and stopped before the usual date of the fourth of every month, when she was originally due. Since then, the patient has experienced continuous bloating and a burning sensation in the lower abdomen. The discomfort increases with eating and causes tension in the back. Reports increased acid reflux symptoms. Denies dysuria, hematuria, reports some urinary frequency. Denies dyspareunia, discharge, vaginal itching. Reports some constipation, denies blood in stool.  The patient has tried Miralax and Doterra digestion pills without relief. She reports occasional nausea, but no vomiting. She has had bowel movements, but not a 'good one' in a few days.   The patient also reports daily headaches, which she describes as pressure behind the eyes and on the front of the head. Denies antihistamine use. She has been sleeping excessively, often from 5 pm to 9:30 pm, and then again from 11 pm. She has not been taking her prescribed vitamin D supplement, despite a previous diagnosis of very low vitamin D levels.       Medications: Outpatient Medications Prior to Visit  Medication Sig   Cholecalciferol 1.25 MG (50000 UT) TABS Take 1.25 mg by mouth once a week. (Patient not taking: Reported on 06/29/2023)   hydrOXYzine (ATARAX) 25 MG tablet Take 1 tablet (25 mg  total) by mouth 3 (three) times daily as needed for anxiety.   ibuprofen (ADVIL,MOTRIN) 800 MG tablet Take 1 tablet (800 mg total) by mouth every 8 (eight) hours as needed.   No facility-administered medications prior to visit.    Review of Systems  Constitutional:  Negative for fatigue and fever.  HENT:  Positive for congestion.   Respiratory:  Negative for cough and shortness of breath.   Cardiovascular:  Negative for chest pain and leg swelling.  Gastrointestinal:  Positive for abdominal distention, abdominal pain and constipation. Negative for anal bleeding and blood in stool.  Neurological:  Positive for headaches. Negative for dizziness.      Objective    BP 99/65 (BP Location: Left Arm, Patient Position: Sitting, Cuff Size: Normal)   Pulse 91   Ht 5\' 8"  (1.727 m)   Wt 166 lb 9.6 oz (75.6 kg)   LMP 06/18/2023   SpO2 100%   BMI 25.33 kg/m    Physical Exam Vitals reviewed.  Constitutional:      Appearance: She is not ill-appearing.  HENT:     Head: Normocephalic.  Eyes:     Conjunctiva/sclera: Conjunctivae normal.  Cardiovascular:     Rate and Rhythm: Normal rate.  Pulmonary:     Effort: Pulmonary effort is normal. No respiratory distress.  Abdominal:     General: Abdomen is flat. There is no distension.     Palpations: Abdomen is soft.     Tenderness: There is abdominal tenderness in the suprapubic area.  Neurological:     General:  No focal deficit present.     Mental Status: She is alert and oriented to person, place, and time.  Psychiatric:        Mood and Affect: Mood normal.        Behavior: Behavior normal.     Results for orders placed or performed in visit on 06/29/23  POCT urine pregnancy  Result Value Ref Range   Preg Test, Ur Negative Negative    Assessment & Plan     1. Abdominal distention 2. Lower abdominal pain Reports of bloating, discomfort, and irregular bowel movements. No blood in stool or dysuria. -UA negative. Culture  ordered. -Continue Miralax daily for a week to improve bowel regularity. -Consider switching to Metamucil if no improvement with Miralax. -increase fluids. -tums prn for reflux -check cmp  - POCT urine pregnancy - TSH + free T4 - Comprehensive Metabolic Panel (CMET) - Urine Culture  3. Irregular menstrual bleeding -Pregnancy test negative. -Observe for now as irregular periods can occur due to various factors -ordered tsh/t4  4. Vitamin D deficiency Reports of excessive sleepiness. History of low Vitamin D levels. -Resume Vitamin D supplement once a week. -Check Vitamin D levels in current blood work; if still < 10 would consider more aggressive replacement - Vitamin D (25 hydroxy)  5. Sinus headache Reports of pressure sensation in the front of the head and behind the eyes. -Start Claritin daily in the morning -Start Flonase nasal spray at night to reduce inflammation. -Consider saline nasal sprays or neti pot for additional relief.  - fluticasone (FLONASE) 50 MCG/ACT nasal spray; Place 2 sprays into both nostrils daily.  Dispense: 16 g; Refill: 6 - loratadine (CLARITIN) 10 MG tablet; Take 1 tablet (10 mg total) by mouth daily. In the morning  Dispense: 90 tablet; Refill: 1        Return if symptoms worsen or fail to improve.      I, Alfredia Ferguson, PA-C have reviewed all documentation for this visit. The documentation on  06/29/23   for the exam, diagnosis, procedures, and orders are all accurate and complete.  Alfredia Ferguson, PA-C [facility] [id number] [place], [place], [address] Office: [phone] Fax: [phone]   [facility]

## 2023-06-30 ENCOUNTER — Other Ambulatory Visit: Payer: Self-pay | Admitting: Physician Assistant

## 2023-06-30 DIAGNOSIS — E559 Vitamin D deficiency, unspecified: Secondary | ICD-10-CM

## 2023-06-30 LAB — TSH+FREE T4
Free T4: 1.03 ng/dL (ref 0.82–1.77)
TSH: 2.41 u[IU]/mL (ref 0.450–4.500)

## 2023-06-30 LAB — COMPREHENSIVE METABOLIC PANEL
ALT: 6 IU/L (ref 0–32)
AST: 11 IU/L (ref 0–40)
Albumin: 4.4 g/dL (ref 4.0–5.0)
Alkaline Phosphatase: 53 IU/L (ref 44–121)
BUN/Creatinine Ratio: 9 (ref 9–23)
BUN: 8 mg/dL (ref 6–20)
Bilirubin Total: 0.3 mg/dL (ref 0.0–1.2)
CO2: 23 mmol/L (ref 20–29)
Calcium: 9.6 mg/dL (ref 8.7–10.2)
Chloride: 106 mmol/L (ref 96–106)
Creatinine, Ser: 0.89 mg/dL (ref 0.57–1.00)
Globulin, Total: 2.9 g/dL (ref 1.5–4.5)
Glucose: 83 mg/dL (ref 70–99)
Potassium: 4.2 mmol/L (ref 3.5–5.2)
Sodium: 142 mmol/L (ref 134–144)
Total Protein: 7.3 g/dL (ref 6.0–8.5)
eGFR: 93 mL/min/{1.73_m2} (ref >59)

## 2023-06-30 LAB — VITAMIN D 25 HYDROXY (VIT D DEFICIENCY, FRACTURES): Vit D, 25-Hydroxy: 20.3 ng/mL — ABNORMAL LOW (ref 30.0–100.0)

## 2023-07-13 ENCOUNTER — Ambulatory Visit: Payer: Self-pay

## 2023-07-13 ENCOUNTER — Ambulatory Visit (INDEPENDENT_AMBULATORY_CARE_PROVIDER_SITE_OTHER): Payer: 59 | Admitting: Physician Assistant

## 2023-07-13 ENCOUNTER — Encounter: Payer: Self-pay | Admitting: Physician Assistant

## 2023-07-13 VITALS — BP 115/71 | HR 82 | Temp 98.6°F | Ht 68.0 in | Wt 168.0 lb

## 2023-07-13 DIAGNOSIS — K5909 Other constipation: Secondary | ICD-10-CM

## 2023-07-13 DIAGNOSIS — N63 Unspecified lump in unspecified breast: Secondary | ICD-10-CM | POA: Diagnosis not present

## 2023-07-13 DIAGNOSIS — Z3200 Encounter for pregnancy test, result unknown: Secondary | ICD-10-CM | POA: Diagnosis not present

## 2023-07-13 DIAGNOSIS — N926 Irregular menstruation, unspecified: Secondary | ICD-10-CM

## 2023-07-13 LAB — POCT URINE PREGNANCY: Preg Test, Ur: NEGATIVE

## 2023-07-13 NOTE — Telephone Encounter (Signed)
Message from Phill Myron sent at 07/13/2023  9:50 AM EDT  Summary: swollen breast, bloating   Swollen breast, hard, itchy breast, nipples enlarged,  bloating. Bowel movements are no longer consistent         Chief Complaint: swollen and hard breast Symptoms: itchy rash to areola, veins more prominent, swelling to lower abd, constipation Frequency: 3 days ago  Pertinent Negatives: Patient denies pregnant fever, redness,nipple discharge Disposition: [] ED /[] Urgent Care (no appt availability in office) / [x] Appointment(In office/virtual)/ []  [facility]/ [] Home Care/ [] Refused Recommended Disposition /[] [facility]/ []  Follow-up with PCP Additional Notes:   Reason for Disposition  [1] Breast tenderness and fullness AND [2] pregnant  Answer Assessment - Initial Assessment Questions 1. SYMPTOM: "What's the main symptom you're concerned about?"  (e.g., lump, pain, rash, nipple discharge)     Swollen breasts, hard, areola itching, swollen 2. LOCATION: "Where is the *No Answer* located?"     Both  3. ONSET: "When did sx  start?"     Bloated x 3 weeks ago - few days ago woke up with breast  sx  4. PRIOR HISTORY: "Do you have any history of prior problems with your breasts?" (e.g., lumps, cancer, fibrocystic breast disease)     no 5. CAUSE: "What do you think is causing this symptom?"     unsure 6. OTHER SYMPTOMS: "Do you have any other symptoms?" (e.g., fever, breast pain, redness or rash, nipple discharge)     Bloated, constipation every day normal - at least 2 day to have BM. Swollen abdomen to lower abd  7. PREGNANCY-BREASTFEEDING: "Is there any chance you are pregnant?" "When was your last menstrual period?" "Are you breastfeeding?"     Unsure/LMP: 2 last month LMP 06/18/23 nausea  Protocols used: Breast Symptoms-A-AH

## 2023-07-13 NOTE — Progress Notes (Signed)
Established patient visit  Patient: Brianna Ellis   DOB: [birth year]   22 y.o. Female  MRN: [id number] Visit Date: 07/13/2023  Today's healthcare provider: [name], PA-C   Chief Complaint  Patient presents with   breast swelling    Patient states she had a normal period that started on 05/25/23. Then she had an abnormal one on 06/18/23. She states the first day was very heavy and then after 2 more days was done.  She states she did a pregnancy test on 06/29/23 and it was negative.  She states that for the last 4 days she has felt like her breast have been heavy, swollen and hard.    Subjective     Discussed the use of AI scribe software for clinical note transcription with the patient, who gave verbal consent to proceed.  History of Present Illness   The patient, with a history of regular menstrual cycles, presents with concerns about recent changes in her menstrual cycle. In June, she experienced two menstrual periods in the same month, which is unusual for her. The second period was notably heavy, with one day of significant bleeding that was described as 'blood, blood, blood, blood.' This heavy bleeding was not associated with pain and did not disrupt her sleep. Since this heavy period, she has not had another menstrual period.  In addition to the changes in her menstrual cycle, the patient reports breast tenderness and bloating that started about three to four days ago. She describes her breasts as 'huge' and 'veiny,' and the tenderness is significant enough to disrupt her usual sleeping position. She also reports constipation, with bowel movements occurring every other day or every two days, a change from her usual daily bowel movements.  The patient is sexually active and does not use protection. She had a negative pregnancy test on July 9th, but has had sexual intercourse since then. She has not retested for pregnancy since the initial negative result.           06/29/2023    9:32  AM 03/03/2023    1:28 PM 03/20/2022    3:37 PM  Depression screen PHQ 2/9  Decreased Interest 1 1 1   Down, Depressed, Hopeless 0 0 0  PHQ - 2 Score 1 1 1   Altered sleeping  1 2  Tired, decreased energy  1 1  Change in appetite  0 1  Feeling bad or failure about yourself   0 0  Trouble concentrating  0 0  Moving slowly or fidgety/restless  0 0  Suicidal thoughts  0 0  PHQ-9 Score  3 5  Difficult doing work/chores  Not difficult at all Not difficult at all       No data to display          Medications: Outpatient Medications Prior to Visit  Medication Sig   Cholecalciferol 1.25 MG (50000 UT) TABS Take 1.25 mg by mouth once a week.   fluticasone (FLONASE) 50 MCG/ACT nasal spray Place 2 sprays into both nostrils daily.   hydrOXYzine (ATARAX) 25 MG tablet Take 1 tablet (25 mg total) by mouth 3 (three) times daily as needed for anxiety.   ibuprofen (ADVIL,MOTRIN) 800 MG tablet Take 1 tablet (800 mg total) by mouth every 8 (eight) hours as needed.   loratadine (CLARITIN) 10 MG tablet Take 1 tablet (10 mg total) by mouth daily. In the morning   No facility-administered medications prior to visit.    Review of Systems  All other  systems reviewed and are negative.  Except see HPI        Objective    BP 115/71 (BP Location: Left Arm, Patient Position: Sitting, Cuff Size: Normal)   Pulse 82   Temp 98.6 F (37 C) (Oral)   Ht 5\' 8"  (1.727 m)   Wt 168 lb (76.2 kg)   LMP 06/18/2023   SpO2 98%   BMI 25.54 kg/m      Physical Exam Abdominal:     General: Abdomen is flat. Bowel sounds are normal. There is distension.     Palpations: Abdomen is soft.     Tenderness: There is abdominal tenderness (low abd).      Results for orders placed or performed in visit on 07/13/23  POCT urine pregnancy  Result Value Ref Range   Preg Test, Ur Negative Negative    Assessment & Plan        Menstrual Irregularities: Two menstrual periods in June with one being unusually heavy.  No similar past episodes. No associated pain. Normal Pap smear a few months ago. -Refer to OBGYN for further evaluation.  Breast Tenderness: Recent onset of breast enlargement and tenderness, worse at night. -Apply coconut oil for moisturization and prevention of stretch marks. -Use Tylenol for pain relief as needed but not NSAIDs in case she is pregnant.     Possible Pregnancy: Recent unprotected intercourse. Symptoms include breast enlargement, tenderness, and bloating. Pregnancy test on July 9th was negative. -Repeat pregnancy test today. -If negative, repeat pregnancy test 10 days after last intercourse.  Constipation: Inconsistent bowel movements, every other day to every two days. -Adjust diet to include more probiotics, fiber, fruits, and vegetables. Metamucil OTC PRN. -Consider daily yogurt or kefir, dried prunes or apricots, and oatmeal.   No follow-ups on file.    The patient was advised to call back or seek an in-person evaluation if the symptoms worsen or if the condition fails to improve as anticipated.  I discussed the assessment and treatment plan with the patient. The patient was provided an opportunity to ask questions and all were answered. The patient agreed with the plan and demonstrated an understanding of the instructions.  I, [name], PA-C have reviewed all documentation for this visit. The documentation on 07/13/23 for the exam, diagnosis, procedures, and orders are all accurate and complete.  [name], [facility], MMS [facility] [phone] (phone) [phone] (fax)   [facility]

## 2023-07-14 LAB — HCG, SERUM, QUALITATIVE: hCG,Beta Subunit,Qual,Serum: NEGATIVE m[IU]/mL (ref <6)

## 2023-07-15 ENCOUNTER — Ambulatory Visit: Payer: 59 | Admitting: Obstetrics and Gynecology

## 2023-07-15 ENCOUNTER — Encounter: Payer: Self-pay | Admitting: Obstetrics and Gynecology

## 2023-07-15 VITALS — BP 110/64 | Ht 68.0 in | Wt 172.0 lb

## 2023-07-15 DIAGNOSIS — N644 Mastodynia: Secondary | ICD-10-CM | POA: Diagnosis not present

## 2023-07-15 NOTE — Patient Instructions (Signed)
I value your feedback and you entrusting us with your care. If you get a [facility] patient survey, I would appreciate you taking the time to let us know about your experience today. Thank you! ? ? ?

## 2023-07-15 NOTE — Progress Notes (Signed)
[name], PA-C   Chief Complaint  Patient presents with   Breast Exam    Enlarged/swollen breasts (both), sore x 4 days    HPI:      Brianna Ellis is a 22 y.o. G0P0000 whose LMP was Patient's last menstrual period was 06/18/2023 (approximate)., presents today for NP eval of bilat breast tenderness/swelling for 4 days, referred by PCP. No masses, no erythema. Feels like nipples larger and bra fits tighter. Drinks caffeine daily in coffee and sodas. Uses MJ once per wk. FH breast cancer in her P[facility]. Neg serum beta 07/13/23. Menses are usually monthly, lasting 4 days, light flow, no BTB, no dysmen. Had menses 05/25/23 and 06/18/23 which was a few days early for pt.  She is sexually active, no new partners. Declines BC. Conception ok, not taking PNVs. 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   Encounter for counseling regarding contraception 01/19/2020    Past Surgical History:  Procedure Laterality Date   OTHER SURGICAL HISTORY     left leg surgery   WISDOM TOOTH EXTRACTION      Family History  Problem Relation Age of Onset   Diabetes Mother    Ovarian cancer Maternal Grandmother        unknown age   Hypertension Maternal Grandfather    Breast cancer Paternal Great-grandmother        unknown age    Social History   Socioeconomic History   Marital status: Single    Spouse name: Not on file   Number of children: Not on file   Years of education: Not on file   Highest education level: Not on file  Occupational History   Not on file  Tobacco Use   Smoking status: Never   Smokeless tobacco: Never  Vaping Use   Vaping status: Every Day   Start date: 12/21/2017   Substances: Nicotine, Flavoring  Substance and Sexual Activity   Alcohol use: Yes    Comment:  occasionaly   Drug use: Yes    Types: Marijuana   Sexual activity: Yes    Birth control/protection: None  Other Topics Concern   Not on file  Social History Narrative   Not on file   Social Determinants of Health   Financial Resource Strain: Not on file  Food Insecurity: Not on file  Transportation Needs: Not on file  Physical Activity: Not on file  Stress: Not on file  Social Connections: Not on file  Intimate Partner Violence: Not on file    Outpatient Medications Prior to Visit  Medication Sig Dispense Refill   Cholecalciferol 1.25 MG (50000 UT) TABS Take 1.25 mg by mouth once a week. 8 tablet 0   fluticasone (FLONASE) 50 MCG/ACT nasal spray Place 2 sprays into both nostrils daily. 16 g 6   hydrOXYzine (ATARAX) 25 MG tablet Take 1 tablet (25 mg total) by mouth 3 (three) times daily as needed for anxiety. 20 tablet 0   ibuprofen (ADVIL,MOTRIN) 800 MG tablet Take 1 tablet (800 mg total) by mouth every 8 (eight) hours as needed. 30 tablet 0   loratadine (CLARITIN) 10 MG tablet Take 1 tablet (10 mg total) by mouth daily. In the morning 90 tablet 1   No facility-administered medications prior to visit.  ROS:  Review of Systems BREAST: No symptoms   OBJECTIVE:   Vitals:  BP 110/64   Ht 5\' 8"  (1.727 m)   Wt 172 lb (78 kg)   LMP 06/18/2023 (Approximate)   BMI 26.15 kg/m   Physical Exam Vitals reviewed.  Pulmonary:     Effort: Pulmonary effort is normal.  Chest:  Breasts:    Breasts are symmetrical.     Right: No inverted nipple, mass, nipple discharge, skin change or tenderness.     Left: No inverted nipple, mass, nipple discharge, skin change or tenderness.     Comments: NEG BREAST EXAM; NO MASSES BILAT Musculoskeletal:        General: Normal range of motion.     Cervical back: Normal range of motion.  Skin:    General: Skin is warm and dry.  Neurological:     General: No focal deficit present.     Mental Status: She is alert and oriented to person,  place, and time.     Cranial Nerves: No cranial nerve deficit.  Psychiatric:        Mood and Affect: Mood normal.        Behavior: Behavior normal.        Thought Content: Thought content normal.        Judgment: Judgment normal.     Assessment/Plan: Mastodynia--neg breast exam. Sx most likely hormonal due to upcoming menses. D/C caffeine as well. F/u prn. If sx persist, can try Vit E and evening primrose supp. Reassurance.   Start PNVs.    Return if symptoms worsen or fail to improve.  Brianna Wiemers B. Shannel Zahm, PA-C 07/15/2023 4:06 PM

## 2023-08-05 ENCOUNTER — Telehealth: Payer: Self-pay

## 2023-08-05 NOTE — Telephone Encounter (Signed)
Copied from CRM [id number]. Topic: Referral - Request for Referral >> Aug 05, 2023 10:02 AM Macon Large wrote: Has patient seen PCP for this complaint? Yes.   *If NO, is insurance requiring patient see PCP for this issue before PCP can refer them? Referral for which specialty: GI Preferred provider/office: GI for [facility] and please include urgent on the referral requests so pt can be scheduled asap Reason for referral: bloating

## 2023-08-05 NOTE — Telephone Encounter (Signed)
Pt called once again and requested an update on the referral request to [facility].

## 2023-08-06 ENCOUNTER — Telehealth: Payer: Self-pay

## 2023-08-06 NOTE — Telephone Encounter (Signed)
I do not see an order for ref to GI   Copied from CRM [id number]. Topic: Referral - Status >> Aug 06, 2023  2:37 PM Phill Myron wrote: Reason for CRM:  Brianna Ellis is calling in regards to the status of her GI referral and ask if someone can call her  today. She has been dealing with this issue since last month. Please advise

## 2023-08-06 NOTE — Telephone Encounter (Signed)
I have pended the GI referral for you. Please review patient's message and signed referral if no other options are needed.

## 2023-08-07 NOTE — Telephone Encounter (Signed)
Spoke with pt. Pt agreed to schedule a fu appt

## 2023-08-08 DIAGNOSIS — R300 Dysuria: Secondary | ICD-10-CM | POA: Diagnosis not present

## 2023-08-10 ENCOUNTER — Ambulatory Visit (INDEPENDENT_AMBULATORY_CARE_PROVIDER_SITE_OTHER): Payer: 59 | Admitting: Physician Assistant

## 2023-08-10 ENCOUNTER — Encounter: Payer: Self-pay | Admitting: Physician Assistant

## 2023-08-10 VITALS — BP 112/74 | HR 87 | Ht 68.0 in | Wt 168.0 lb

## 2023-08-10 DIAGNOSIS — N926 Irregular menstruation, unspecified: Secondary | ICD-10-CM

## 2023-08-10 DIAGNOSIS — N3000 Acute cystitis without hematuria: Secondary | ICD-10-CM

## 2023-08-10 DIAGNOSIS — R109 Unspecified abdominal pain: Secondary | ICD-10-CM

## 2023-08-10 NOTE — Patient Instructions (Signed)
Map

## 2023-08-10 NOTE — Progress Notes (Signed)
Established patient visit  Patient: Brianna Ellis   DOB: 11-Nov-2001   22 y.o. Female  MRN: [id number] Visit Date: 08/10/2023  Today's healthcare provider: [name], PA-C   Chief Complaint  Patient presents with   Medical Management of Chronic Issues    Would like a referral to GI issues    Subjective     Discussed the use of AI scribe software for clinical note transcription with the patient, who gave verbal consent to proceed.  History of Present Illness   The patient, with a recent diagnosis of a urinary tract infection (UTI), presents with a two-month history of abdominal discomfort, bloating, and changes in bowel habits. The discomfort is described as spasmodic, located in the lower abdomen, and rated as an eight out of ten in severity. The patient reports a correlation between eating and the onset of symptoms, with bloating and discomfort occurring after meals. Bowel movements, which have become more regular with dietary changes, seem to alleviate the discomfort temporarily. The patient has made dietary changes, including increased intake of fruits, nuts, and oatmeal, which has improved regularity of bowel movements. The patient denies any significant family history of gastrointestinal issues and has no history of similar symptoms. The patient is seeking further investigation into the cause of the symptoms, including potential imaging studies.            06/29/2023    9:32 AM 03/03/2023    1:28 PM 03/20/2022    3:37 PM  PHQ9 SCORE ONLY  PHQ-9 Total Score 1 3 5         06/29/2023    9:32 AM 03/03/2023    1:28 PM 03/20/2022    3:37 PM  Depression screen PHQ 2/9  Decreased Interest 1 1 1   Down, Depressed, Hopeless 0 0 0  PHQ - 2 Score 1 1 1   Altered sleeping  1 2  Tired, decreased energy  1 1  Change in appetite  0 1  Feeling bad or failure about yourself   0 0  Trouble concentrating  0 0  Moving slowly or fidgety/restless  0 0  Suicidal thoughts  0 0  PHQ-9 Score  3  5  Difficult doing work/chores  Not difficult at all Not difficult at all   GAD7 score of 0 today   Medications: Outpatient Medications Prior to Visit  Medication Sig   Cholecalciferol 1.25 MG (50000 UT) TABS Take 1.25 mg by mouth once a week.   fluticasone (FLONASE) 50 MCG/ACT nasal spray Place 2 sprays into both nostrils daily.   hydrOXYzine (ATARAX) 25 MG tablet Take 1 tablet (25 mg total) by mouth 3 (three) times daily as needed for anxiety.   ibuprofen (ADVIL,MOTRIN) 800 MG tablet Take 1 tablet (800 mg total) by mouth every 8 (eight) hours as needed.   loratadine (CLARITIN) 10 MG tablet Take 1 tablet (10 mg total) by mouth daily. In the morning   No facility-administered medications prior to visit.    Review of Systems  All other systems reviewed and are negative.  Except see HPI       Objective    BP 112/74 (BP Location: Left Arm, Patient Position: Sitting, Cuff Size: Normal)   Pulse 87   Ht 5\' 8"  (1.727 m)   Wt 168 lb (76.2 kg)   LMP 06/18/2023 (Approximate)   SpO2 100%   BMI 25.54 kg/m     Physical Exam Vitals reviewed.  Constitutional:      General: She is not in  acute distress.    Appearance: Normal appearance. She is well-developed. She is not diaphoretic.  HENT:     Head: Normocephalic and atraumatic.  Eyes:     General: No scleral icterus.    Conjunctiva/sclera: Conjunctivae normal.  Neck:     Thyroid: No thyromegaly.  Cardiovascular:     Rate and Rhythm: Normal rate and regular rhythm.     Pulses: Normal pulses.     Heart sounds: Normal heart sounds. No murmur heard. Pulmonary:     Effort: Pulmonary effort is normal.     Breath sounds: Normal breath sounds. No wheezing or rhonchi.  Abdominal:     General: Abdomen is flat. Bowel sounds are normal. There is no distension.     Palpations: Abdomen is soft. There is no mass.     Tenderness: There is no abdominal tenderness. There is no right CVA tenderness, left CVA tenderness, guarding or rebound.      Hernia: No hernia is present.  Musculoskeletal:     Cervical back: Neck supple.     Right lower leg: No edema.     Left lower leg: No edema.  Lymphadenopathy:     Cervical: No cervical adenopathy.  Skin:    General: Skin is warm and dry.     Findings: No rash.  Neurological:     Mental Status: She is alert and oriented to person, place, and time. Mental status is at baseline.  Psychiatric:        Mood and Affect: Mood normal.        Behavior: Behavior normal.      No results found for any visits on 08/10/23.  Assessment & Plan        Abdominal Discomfort X 2 mo Lower abdominal spasms, bloating, and constipation. Symptoms improve after bowel movements and worsen after meals. No clear trigger identified. Symptoms suggestive of Irritable Bowel Syndrome (IBS) vs malabsorption. Denies any hx of depression or anxiety. GAD7 score was 0. Declined Rx of antispasmodic drugs. -Continue current diet modifications focusing on soluble fibers and avoiding foods that cause symptoms. IDS diet and low fodmap patient instructions were provided. -Consider stool sample testing, blood work after completion of UTI treatment. -Referral to gastroenterology for further evaluation, per pt's request.  Urinary Tract Infection (UTI) Acute? X 2 days Currently on treatment with Nitrofurantoin for 5 days. Symptoms include burning and itching during urination. -Complete the course of Nitrofurantoin. -Follow-up in one week to ensure resolution of UTI symptoms.  Menstrual Irregularities Previously reported menstrual problems, currently regular. -No active intervention.  General Health Maintenance / Followup Plans -Follow-up in one week to assess resolution of UTI and response to dietary modifications. -Consider pelvic exam after UTI treatment completion.      No follow-ups on file.     The patient was advised to call back or seek an in-person evaluation if the symptoms worsen or if the condition  fails to improve as anticipated.  I discussed the assessment and treatment plan with the patient. The patient was provided an opportunity to ask questions and all were answered. The patient agreed with the plan and demonstrated an understanding of the instructions.  I, [name], PA-C have reviewed all documentation for this visit. The documentation on  08/10/23 for the exam, diagnosis, procedures, and orders are all accurate and complete.  [name], [facility], [facility] [phone] (phone) [phone] (fax)  [facility]

## 2023-08-18 ENCOUNTER — Telehealth: Payer: Self-pay

## 2023-08-18 DIAGNOSIS — R109 Unspecified abdominal pain: Secondary | ICD-10-CM

## 2023-08-18 DIAGNOSIS — K5909 Other constipation: Secondary | ICD-10-CM

## 2023-08-18 NOTE — Telephone Encounter (Signed)
Copied from CRM [id number]. Topic: Referral - Status >> Aug 18, 2023  4:06 PM Dondra Prader A wrote: Reason for CRM: Pt is calling to check on her referral status for the GI doctor. Pt was seen on 08/10/2023 by PCP for her referral. Pt would like a call back to discuss.

## 2023-09-15 DIAGNOSIS — J029 Acute pharyngitis, unspecified: Secondary | ICD-10-CM | POA: Diagnosis not present

## 2023-09-15 DIAGNOSIS — R079 Chest pain, unspecified: Secondary | ICD-10-CM | POA: Diagnosis not present

## 2023-09-15 DIAGNOSIS — J069 Acute upper respiratory infection, unspecified: Secondary | ICD-10-CM | POA: Diagnosis not present

## 2023-09-15 DIAGNOSIS — R110 Nausea: Secondary | ICD-10-CM | POA: Diagnosis not present

## 2023-09-28 ENCOUNTER — Other Ambulatory Visit: Payer: Self-pay

## 2023-09-28 DIAGNOSIS — S8390XA Sprain of unspecified site of unspecified knee, initial encounter: Secondary | ICD-10-CM | POA: Insufficient documentation

## 2023-09-28 DIAGNOSIS — M25569 Pain in unspecified knee: Secondary | ICD-10-CM | POA: Insufficient documentation

## 2023-09-29 NOTE — Progress Notes (Signed)
Celso Amy, PA-C [address]  [address]  [place], [place] [address]  Main: [phone]  Fax: [phone]   Gastroenterology Consultation  Referring Provider:     [name], PA-C Primary Care Physician:  Brianna Lat, PA-C Primary Gastroenterologist:  Celso Amy, PA-C  Reason for Consultation:     Abdominal pain, constipation        HPI:   Brianna Ellis is a 22 y.o. y/o female referred for consultation & management  by Brianna Lat, PA-C.    Patient is referred from her PCP to evaluate lower abdominal cramping, bloating, and constipation.  Suspected to have irritable bowel syndrome.  Patient declined prescription of antispasmodic medicine.  Patient requested GI referral.  No current treatment.  No previous GI evaluation.  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  Patient is followed by OB/GYN.  Lab 06/2023 showed negative pregnancy test, normal CMP and TSH and free T4.  Vitamin D was low and she is taking supplement.  Current symptoms: She has had chronic intermittent episodes of constipation, lower abdominal cramping and bloating for many years.  Also has intermittent episodes of heartburn for several months.  Last bowel movement was 4 days ago.  She has tried OTC Dulcolax as needed.  Tried MiraLAX which did not work well.  No other constipation or GERD treatment.  She denies any alarm symptoms such as nausea, vomiting, rectal bleeding, or unintentional weight loss.  Past Medical History:  Diagnosis Date   Anxiety     Past Surgical History:  Procedure Laterality Date   OTHER SURGICAL HISTORY     left leg surgery   WISDOM TOOTH EXTRACTION      Prior to Admission medications   Medication Sig Start Date End Date Taking? Authorizing Provider  Cholecalciferol 1.25 MG (50000 UT) TABS Take 1.25 mg by mouth once a week.  03/08/23   Alfredia Ferguson, PA-C  fluticasone (FLONASE) 50 MCG/ACT nasal spray Place 2 sprays into both nostrils daily. 06/29/23   Alfredia Ferguson, PA-C  hydrOXYzine (ATARAX) 25 MG tablet Take 1 tablet (25 mg total) by mouth 3 (three) times daily as needed for anxiety. 09/27/22   Phineas Semen, MD  ibuprofen (ADVIL,MOTRIN) 800 MG tablet Take 1 tablet (800 mg total) by mouth every 8 (eight) hours as needed. 06/11/15   Beers, Charmayne Sheer, PA-C  loratadine (CLARITIN) 10 MG tablet Take 1 tablet (10 mg total) by mouth daily. In the morning 06/29/23   Drubel, Lillia Abed, PA-C  nitrofurantoin, macrocrystal-monohydrate, (MACROBID) 100 MG capsule SMARTSIG:1 Capsule(s) By Mouth Morning-Night 08/08/23   [provider]    Family History  Problem Relation Age of Onset   Diabetes Mother    Ovarian cancer Maternal Grandmother        unknown age   Hypertension Maternal Grandfather    Breast cancer Paternal Great-grandmother        unknown age     Social History   Tobacco Use   Smoking status: Never   Smokeless tobacco: Never  Vaping Use   Vaping status: Every Day   Start date: 12/21/2017   Substances: Nicotine, Flavoring  Substance Use Topics   Alcohol use: Yes    Comment: occasionaly   Drug use: Yes    Types: Marijuana    Allergies as of 09/30/2023   (No Known Allergies)    Review of Systems:    All  systems reviewed and negative except where noted in HPI.   Physical Exam:  BP 121/77   Pulse 97   Temp 97.8 F (36.6 C)   Ht 5\' 8"  (1.727 m)   Wt 171 lb 3.2 oz (77.7 kg)   BMI 26.03 kg/m  No LMP recorded.  General:   Alert,  Well-developed, well-nourished, pleasant and cooperative in NAD Lungs:  Respirations even and unlabored.  Clear throughout to auscultation.   No wheezes, crackles, or rhonchi. No acute distress. Heart:  Regular rate and rhythm; no murmurs, clicks, rubs, or gallops. Abdomen:  Normal bowel sounds.  No bruits.  Soft, and non-distended without masses,  hepatosplenomegaly or hernias noted.  Mild bilateral lower abdominal Tenderness.  No guarding or rebound tenderness.    Neurologic:  Alert and oriented x3;  grossly normal neurologically. Psych:  Alert and cooperative. Normal mood and affect.  Imaging Studies: No results found.  Assessment and Plan:   [name] is a 22 y.o. y/o female has been referred for:  1.  Chronic idiopathic constipation  Rx Amitiza BID, #60, 3 RF.  High-fiber diet with fruits, vegetables, whole grains.  Drink 64 ounces of water daily.  2.  Irritable bowel syndrome with constipation  Low FODMAP diet.  OTC Align probiotic 1 capsule once daily.  3.  GERD  Recommend Lifestyle Modifications to prevent Acid Reflux.  Rec. Avoid coffee, sodas, peppermint, alcohol, chocolate, citrus fruits, and spicey foods.  Avoid eating 2-3 hours before bedtime.   She can take OTC Tums antacid, Pepcid 20 Mg BD twice daily, or OTC Prilosec 20 Mg once daily as needed.  Follow up in 3 months or sooner if worsening GI symptoms.  Celso Amy, PA-C

## 2023-09-30 ENCOUNTER — Encounter: Payer: Self-pay | Admitting: Physician Assistant

## 2023-09-30 ENCOUNTER — Ambulatory Visit: Payer: 59 | Admitting: Physician Assistant

## 2023-09-30 VITALS — BP 121/77 | HR 97 | Temp 97.8°F | Ht 68.0 in | Wt 171.2 lb

## 2023-09-30 DIAGNOSIS — K219 Gastro-esophageal reflux disease without esophagitis: Secondary | ICD-10-CM | POA: Diagnosis not present

## 2023-09-30 DIAGNOSIS — K581 Irritable bowel syndrome with constipation: Secondary | ICD-10-CM | POA: Diagnosis not present

## 2023-09-30 DIAGNOSIS — K5904 Chronic idiopathic constipation: Secondary | ICD-10-CM | POA: Diagnosis not present

## 2023-09-30 MED ORDER — ALIGN 4 MG PO CAPS: 1.0000 | ORAL_CAPSULE | Freq: Every day | ORAL | Status: AC

## 2023-09-30 MED ORDER — LUBIPROSTONE 8 MCG PO CAPS
8.0000 ug | ORAL_CAPSULE | Freq: Two times a day (BID) | ORAL | 2 refills | Status: DC
Start: 2023-09-30 — End: 2023-12-03

## 2023-11-03 ENCOUNTER — Encounter: Payer: Self-pay | Admitting: Physician Assistant

## 2023-11-09 ENCOUNTER — Telehealth: Payer: Self-pay

## 2023-11-09 NOTE — Telephone Encounter (Signed)
Received approval of medication from [facility] -patient is aware.

## 2023-11-30 ENCOUNTER — Telehealth: Payer: Self-pay | Admitting: Physician Assistant

## 2023-11-30 NOTE — Telephone Encounter (Signed)
I called the patient and schedule her for 12/02/23 at 2:00 pm.

## 2023-11-30 NOTE — Telephone Encounter (Signed)
Patient called in to schedule a follow up because she is having abdominal pain, cramps, bloating and pain in lower back. She stated that it feels like bubbling in her stomach. She is schedule to come in on 01/05/24 but she said she doesn't think she can wait that long because she is in pain. Please advised.

## 2023-12-01 NOTE — Progress Notes (Signed)
Celso Amy, PA-C [address]  [address]  [place], [place] [address]  Main: [phone]  Fax: [phone]   Primary Care Physician: [name], PA-C  Primary Gastroenterologist:  Celso Amy, PA-C   CC: F/U Constipation, IBS-C, GERD  HPI: Brianna Ellis is a 22 y.o. female presents for increased abdominal pain, cramps, bloating, low back pain.  Feels gurgling in her stomach.  Was last seen here 2 months ago for abdominal pain and constipation.  History of IBS.  MiraLAX did not work well.  Took OTC Dulcolax as needed.  Was started on Amitiza 8 mcg twice daily, high-fiber diet, low FODMAP diet, align probiotic daily.  Amitiza 8 mcg twice daily helped initially, however in the past week she has had increased constipation.  No longer working.  She has intermittent lower abdominal cramping.  Denies rectal bleeding or weight loss.  Admits to weight gain, abdominal bloating and swelling in her lower abdomen.  No current contraception.  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  No current Tx for GERD.  She is having increased belching and nausea.  No vomiting.  Has tried OTC Tums and Gas-X with little benefit.  She tried drinking sodas.  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  She was referred to vascular surgeon.  Patient is followed by OB/GYN.  06/2023 labs: Normal CBC CMP, TSH, free T4.  Negative hCG.  No history of anemia.  No previous EGD or colonoscopy.  She denies family history of colon cancer or colon polyps.  Current Outpatient Medications  Medication Sig Dispense Refill   Cholecalciferol 1.25 MG (50000 UT) TABS Take 1.25 mg by mouth once a week. 8 tablet 0   fluticasone (FLONASE) 50 MCG/ACT nasal spray Place 2 sprays into both nostrils daily. 16 g 6   hydrOXYzine (ATARAX) 25 MG tablet Take 1 tablet (25 mg total) by mouth 3 (three) times daily as needed for anxiety. 20 tablet 0    ibuprofen (ADVIL,MOTRIN) 800 MG tablet Take 1 tablet (800 mg total) by mouth every 8 (eight) hours as needed. 30 tablet 0   loratadine (CLARITIN) 10 MG tablet Take 1 tablet (10 mg total) by mouth daily. In the morning 90 tablet 1   lubiprostone (AMITIZA) 8 MCG capsule Take 1 capsule (8 mcg total) by mouth 2 (two) times daily with a meal. 60 capsule 2   nitrofurantoin, macrocrystal-monohydrate, (MACROBID) 100 MG capsule SMARTSIG:1 Capsule(s) By Mouth Morning-Night     No current facility-administered medications for this visit.    Allergies as of 12/02/2023   (No Known Allergies)    Past Medical History:  Diagnosis Date   Anxiety     Past Surgical History:  Procedure Laterality Date   OTHER SURGICAL HISTORY     left leg surgery   WISDOM TOOTH EXTRACTION      Review of Systems:    All systems reviewed and negative except where noted in HPI.   Physical Examination:   BP 128/77   Pulse 73   Temp 98.1 F (36.7 C)   Ht 5\' 8"  (1.727 m)   Wt 175 lb 3.2 oz (79.5 kg)   BMI 26.64 kg/m   General: Well-nourished, well-developed in no acute distress.  Lungs: Clear to auscultation bilaterally. Non-labored. Heart: Regular rate and rhythm, no murmurs rubs or gallops.  Abdomen: Bowel sounds are normal; Abdomen is Soft; No hepatosplenomegaly, masses or hernias;  Mild bilateral  Lower Abdominal Tenderness; No upper abdominal tenderness.  No guarding or rebound tenderness. Neuro: Alert and oriented x 3.  Grossly intact.  Psych: Alert and cooperative, normal mood and affect.   Imaging Studies: No results found.  Assessment and Plan:   Brianna Ellis is a 22 y.o. y/o female returns for f/u of:  1.  Constipation, IBS-C 2.  GERD / Belching 3.  Lower abdominal cramping 4.  Abdominal bloating and swelling 5.  Nausea 6.  Weight gain  Plan: -Serum hCG quantitative today -H. pylori breath test today -If serum hCG is negative, then I will order abdominal x-ray to evaluate stool  burden, increase Amitiza to 24 twice daily for constipation, and start her on omeprazole 40 mg once daily for GERD. -If GI symptoms worsen or persist, then consider EGD and colonoscopy.  Celso Amy, PA-C  Follow up in 4 weeks with TG.

## 2023-12-02 ENCOUNTER — Ambulatory Visit: Payer: 59 | Admitting: Physician Assistant

## 2023-12-02 ENCOUNTER — Encounter: Payer: Self-pay | Admitting: Physician Assistant

## 2023-12-02 VITALS — BP 128/77 | HR 73 | Temp 98.1°F | Ht 68.0 in | Wt 175.2 lb

## 2023-12-02 DIAGNOSIS — R142 Eructation: Secondary | ICD-10-CM

## 2023-12-02 DIAGNOSIS — K5900 Constipation, unspecified: Secondary | ICD-10-CM

## 2023-12-02 DIAGNOSIS — R140 Abdominal distension (gaseous): Secondary | ICD-10-CM

## 2023-12-02 DIAGNOSIS — K581 Irritable bowel syndrome with constipation: Secondary | ICD-10-CM | POA: Diagnosis not present

## 2023-12-02 DIAGNOSIS — R110 Nausea: Secondary | ICD-10-CM | POA: Diagnosis not present

## 2023-12-02 DIAGNOSIS — R635 Abnormal weight gain: Secondary | ICD-10-CM | POA: Diagnosis not present

## 2023-12-02 DIAGNOSIS — R1030 Lower abdominal pain, unspecified: Secondary | ICD-10-CM | POA: Diagnosis not present

## 2023-12-02 DIAGNOSIS — K219 Gastro-esophageal reflux disease without esophagitis: Secondary | ICD-10-CM

## 2023-12-02 DIAGNOSIS — R109 Unspecified abdominal pain: Secondary | ICD-10-CM

## 2023-12-03 ENCOUNTER — Encounter: Payer: Self-pay | Admitting: Physician Assistant

## 2023-12-03 ENCOUNTER — Other Ambulatory Visit: Payer: Self-pay | Admitting: Physician Assistant

## 2023-12-03 DIAGNOSIS — K5904 Chronic idiopathic constipation: Secondary | ICD-10-CM

## 2023-12-03 DIAGNOSIS — K219 Gastro-esophageal reflux disease without esophagitis: Secondary | ICD-10-CM

## 2023-12-03 LAB — H. PYLORI BREATH TEST: H pylori Breath Test: NEGATIVE

## 2023-12-03 LAB — HCG, SERUM, QUALITATIVE: hCG,Beta Subunit,Qual,Serum: NEGATIVE m[IU]/mL (ref <6)

## 2023-12-03 MED ORDER — OMEPRAZOLE 40 MG PO CPDR
40.0000 mg | DELAYED_RELEASE_CAPSULE | Freq: Every day | ORAL | 5 refills | Status: DC
Start: 2023-12-03 — End: 2024-03-14

## 2023-12-03 MED ORDER — LUBIPROSTONE 24 MCG PO CAPS
24.0000 ug | ORAL_CAPSULE | Freq: Two times a day (BID) | ORAL | 5 refills | Status: AC
Start: 2023-12-03 — End: 2024-05-31

## 2023-12-09 ENCOUNTER — Emergency Department
Admission: EM | Admit: 2023-12-09 | Discharge: 2023-12-09 | Disposition: A | Discharge status: Home / Self Care | Payer: 59 | Attending: Emergency Medicine | Admitting: Emergency Medicine

## 2023-12-09 ENCOUNTER — Emergency Department: Payer: 59

## 2023-12-09 ENCOUNTER — Encounter: Payer: Self-pay | Admitting: Emergency Medicine

## 2023-12-09 ENCOUNTER — Other Ambulatory Visit: Payer: Self-pay

## 2023-12-09 DIAGNOSIS — S62525A Nondisplaced fracture of distal phalanx of left thumb, initial encounter for closed fracture: Secondary | ICD-10-CM | POA: Insufficient documentation

## 2023-12-09 DIAGNOSIS — W19XXXA Unspecified fall, initial encounter: Secondary | ICD-10-CM | POA: Diagnosis not present

## 2023-12-09 DIAGNOSIS — S6992XA Unspecified injury of left wrist, hand and finger(s), initial encounter: Secondary | ICD-10-CM | POA: Diagnosis not present

## 2023-12-09 DIAGNOSIS — S62524A Nondisplaced fracture of distal phalanx of right thumb, initial encounter for closed fracture: Secondary | ICD-10-CM | POA: Diagnosis not present

## 2023-12-09 MED ORDER — OXYCODONE-ACETAMINOPHEN 5-325 MG PO TABS
1.0000 | ORAL_TABLET | Freq: Once | ORAL | Status: AC
  Administered 2023-12-09: 1 via ORAL
  Filled 2023-12-09: qty 1

## 2023-12-09 NOTE — ED Triage Notes (Signed)
Patient ambulatory to triage with steady gait, without difficulty or distress noted; pt reports PTA that she fell, trying to catcher herself with her rt hand; c/o pain to thumb side

## 2023-12-09 NOTE — Discharge Instructions (Addendum)
Take acetaminophen 650 mg and ibuprofen 400 mg every 6 hours for pain.  Take with food.  Keep in the splint until you are able to see orthopedic doctor for follow-up.  Thank you for choosing [place] for your health care today!  Please see your primary doctor this week for a follow up appointment.   If you have any new, worsening, or unexpected symptoms call your doctor right away or come back to the emergency department for reevaluation.  It was my pleasure to care for you today.   Daneil Dan Modesto Charon, MD

## 2023-12-09 NOTE — ED Provider Notes (Signed)
[facility] Provider Note    Event Date/Time   First MD Initiated Contact with Patient 12/09/23 [phone]     (approximate)   History   Hand Injury   HPI  Brianna Ellis is a 22 y.o. female   Past medical history of no significant past medical history presents with a right thumb injury.  She fell onto her right thumb and has pain to the digit only.  No wrist pain.  No other injuries.      Physical Exam   Triage Vital Signs: ED Triage Vitals  Encounter Vitals Group     BP 12/09/23 0041 (!) 134/92     Systolic BP Percentile --      Diastolic BP Percentile --      Pulse Rate 12/09/23 0041 (!) 104     Resp 12/09/23 0041 20     Temp 12/09/23 0041 98.2 F (36.8 C)     Temp src --      SpO2 12/09/23 0041 100 %     Weight 12/09/23 0041 175 lb (79.4 kg)     Height 12/09/23 0041 5\' 8"  (1.727 m)     Head Circumference --      Peak Flow --      Pain Score 12/09/23 0041 8     Pain Loc --      Pain Education --      Exclude from Growth Chart --     Most recent vital signs: Vitals:   12/09/23 0041 12/09/23 0356  BP: (!) 134/92 128/76  Pulse: (!) 104 92  Resp: 20 18  Temp: 98.2 F (36.8 C) 98.3 F (36.8 C)  SpO2: 100% 100%    General: Awake, no distress.  CV:  Good peripheral perfusion.  Resp:  Normal effort.  Abd:  No distention.  Other:  Tenderness to the distal portion of the right thumb.  No snuffbox tenderness.  All digits appear atraumatic no bony tenderness able to range and at the wrist as well.   ED Results / Procedures / Treatments   Labs (all labs ordered are listed, but only abnormal results are displayed) Labs Reviewed - No data to display  RADIOLOGY I independently reviewed and interpreted x-ray of the hand see right distal phalanx fracture of the thumb I also reviewed radiologist's formal read.   PROCEDURES:  Critical Care performed: No  Procedures   MEDICATIONS ORDERED IN ED: Medications  oxyCODONE-acetaminophen  (PERCOCET/ROXICET) 5-325 MG per tablet 1 tablet (1 tablet Oral Given 12/09/23 0141)   IMPRESSION / MDM / ASSESSMENT AND PLAN / ED COURSE  I reviewed the triage vital signs and the nursing notes.                                Patient's presentation is most consistent with acute complicated illness / injury requiring diagnostic workup.  Differential diagnosis includes, but is not limited to, fracture dislocation of the right thumb   The patient is on the cardiac monitor to evaluate for evidence of arrhythmia and/or significant heart rate changes.  MDM:     Fall with direct trauma to the right thumb with a distal phalanx fracture only.  No snuffbox tenderness otherwise atraumatic exam.  Put in thumb spica splint - discharge with Ortho follow-up.        FINAL CLINICAL IMPRESSION(S) / ED DIAGNOSES   Final diagnoses:  Closed nondisplaced fracture of distal phalanx  of left thumb, initial encounter     Rx / DC Orders   ED Discharge Orders     None        Note:  This document was prepared using Dragon voice recognition software and may include unintentional dictation errors.    Pilar Jarvis, MD 12/09/23 [phone]

## 2023-12-10 DIAGNOSIS — M79644 Pain in right finger(s): Secondary | ICD-10-CM | POA: Diagnosis not present

## 2023-12-10 DIAGNOSIS — S62522A Displaced fracture of distal phalanx of left thumb, initial encounter for closed fracture: Secondary | ICD-10-CM | POA: Diagnosis not present

## 2023-12-14 DIAGNOSIS — S62522A Displaced fracture of distal phalanx of left thumb, initial encounter for closed fracture: Secondary | ICD-10-CM | POA: Diagnosis not present

## 2023-12-16 DIAGNOSIS — S62522A Displaced fracture of distal phalanx of left thumb, initial encounter for closed fracture: Secondary | ICD-10-CM | POA: Diagnosis not present

## 2023-12-16 DIAGNOSIS — S62521A Displaced fracture of distal phalanx of right thumb, initial encounter for closed fracture: Secondary | ICD-10-CM | POA: Diagnosis not present

## 2023-12-21 ENCOUNTER — Telehealth: Payer: Self-pay

## 2023-12-21 NOTE — Progress Notes (Signed)
 Transition Care Management Unsuccessful Follow-up Telephone Call  Date of discharge and from where:  12/09/2023 [facility]  Attempts:  1st Attempt  Reason for unsuccessful TCM follow-up call:  No answer/busy  Kimberl Vig [facility]  [facility], [facility] Guide Direct Dial: [phone]  Website: [email or web address]

## 2023-12-24 ENCOUNTER — Telehealth: Payer: Self-pay

## 2023-12-24 NOTE — Progress Notes (Signed)
 Transition Care Management Unsuccessful Follow-up Telephone Call  Date of discharge and from where:  12/09/2023 [facility]  Attempts:  2nd Attempt  Reason for unsuccessful TCM follow-up call:  Left voice message  Olliver Boyadjian [facility]  [facility], [facility] Resource Care Guide Direct Dial: [phone]  Website: [email or web address]

## 2023-12-29 DIAGNOSIS — S62521D Displaced fracture of distal phalanx of right thumb, subsequent encounter for fracture with routine healing: Secondary | ICD-10-CM | POA: Diagnosis not present

## 2023-12-29 DIAGNOSIS — S62521A Displaced fracture of distal phalanx of right thumb, initial encounter for closed fracture: Secondary | ICD-10-CM | POA: Diagnosis not present

## 2023-12-29 DIAGNOSIS — S62609A Fracture of unspecified phalanx of unspecified finger, initial encounter for closed fracture: Secondary | ICD-10-CM | POA: Insufficient documentation

## 2024-01-05 ENCOUNTER — Ambulatory Visit: Payer: 59 | Admitting: Physician Assistant

## 2024-01-06 DIAGNOSIS — S62521D Displaced fracture of distal phalanx of right thumb, subsequent encounter for fracture with routine healing: Secondary | ICD-10-CM | POA: Diagnosis not present

## 2024-01-06 NOTE — Progress Notes (Deleted)
Brianna Amy, PA-C [address]  [address]  [place], [place] [address]  Main: [phone]  Fax: [phone]   Primary Care Physician: [name], PA-C  Primary Gastroenterologist:  ***  CC: Follow-up GERD, constipation, IBS  HPI: Brianna Ellis is a 23 y.o. female returns for 69-month follow-up of GERD, chronic constipation, and IBS.  3 months ago she was tried on Amitiza 8 mg twice daily, align probiotic once daily, low FODMAP diet, OTC Prilosec or Pepcid daily.  Dietary recommendations for acid reflux.  Amitiza was increased to 24 mcg twice daily.  Currently she is taking omeprazole 40 Mg once daily.  No longer on Amitiza.  12/02/2023 H. pylori breath test was negative.  Pregnancy test negative.  03/2020 CT abdomen pelvis with contrast (to evaluate abdominal pain, bloating, swelling): Findings suspicious for nutcracker syndrome.  Findings consistent with constipation.  Current Outpatient Medications  Medication Sig Dispense Refill   Cholecalciferol 1.25 MG (50000 UT) TABS Take 1.25 mg by mouth once a week. 8 tablet 0   fluticasone (FLONASE) 50 MCG/ACT nasal spray Place 2 sprays into both nostrils daily. 16 g 6   hydrOXYzine (ATARAX) 25 MG tablet Take 1 tablet (25 mg total) by mouth 3 (three) times daily as needed for anxiety. 20 tablet 0   ibuprofen (ADVIL,MOTRIN) 800 MG tablet Take 1 tablet (800 mg total) by mouth every 8 (eight) hours as needed. 30 tablet 0   loratadine (CLARITIN) 10 MG tablet Take 1 tablet (10 mg total) by mouth daily. In the morning 90 tablet 1   lubiprostone (AMITIZA) 24 MCG capsule Take 1 capsule (24 mcg total) by mouth 2 (two) times daily with a meal. 60 capsule 5   nitrofurantoin, macrocrystal-monohydrate, (MACROBID) 100 MG capsule SMARTSIG:1 Capsule(s) By Mouth Morning-Night     omeprazole (PRILOSEC) 40 MG capsule Take 1 capsule (40 mg total) by mouth daily. 30 capsule 5   No current facility-administered medications for this visit.     Allergies as of 01/07/2024   (No Known Allergies)    Past Medical History:  Diagnosis Date   Anxiety     Past Surgical History:  Procedure Laterality Date   OTHER SURGICAL HISTORY     left leg surgery   WISDOM TOOTH EXTRACTION      Review of Systems:    All systems reviewed and negative except where noted in HPI.   Physical Examination:   LMP 12/08/2023 (Exact Date)   General: Well-nourished, well-developed in no acute distress.  Lungs: Clear to auscultation bilaterally. Non-labored. Heart: Regular rate and rhythm, no murmurs rubs or gallops.  Abdomen: Bowel sounds are normal; Abdomen is Soft; No hepatosplenomegaly, masses or hernias;  No Abdominal Tenderness; No guarding or rebound tenderness. Neuro: Alert and oriented x 3.  Grossly intact.  Psych: Alert and cooperative, normal mood and affect.   Imaging Studies: DG Hand Complete Right Result Date: 12/09/2023 CLINICAL DATA:  Pain to thumb after falling on hand EXAM: RIGHT HAND - COMPLETE 3+ VIEW COMPARISON:  None. FINDINGS: Acute comminuted nondisplaced fracture of the base of the distal phalanx of the thumb. Question extension into the first IP joint. Soft tissue swelling. Is likely extension into the IMPRESSION: Acute comminuted nondisplaced fracture of the base of the distal phalanx of the thumb. Electronically Signed   By: Minerva Fester M.D.   On: 12/09/2023 01:34    Assessment and Plan:   Brianna Ellis is a 23 y.o. y/o female ***    Brianna Ellis  Brianna Pebbles, PA-C  Follow up ***  BP check ***

## 2024-01-07 ENCOUNTER — Ambulatory Visit: Payer: 59 | Admitting: Physician Assistant

## 2024-01-12 ENCOUNTER — Other Ambulatory Visit: Payer: Self-pay

## 2024-01-12 NOTE — Progress Notes (Deleted)
 Celso Amy, PA-C [address]  [address]  [place], [place] [address]  Main: [phone]  Fax: [phone]   Primary Care Physician: [name], PA-C  Primary Gastroenterologist:  ***  CC:  HPI: KOLLEEN DURRETTE is a 23 y.o. female returns for 5-week follow-up of irritable bowel syndrome, constipation, GERD, belching, abdominal cramping/bloat bloating/swelling, nausea.    She has been taking omeprazole 40 Mg once daily for the past month.  Amitiza was increased to 24 mcg twice daily for constipation.  Current symptoms  11/2023: Pregnancy test negative.  H. pylori breath test negative.  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  Patient is followed by OB/GYN.   Lab 06/2023 showed negative pregnancy test, normal CMP and TSH and free T4.  Vitamin D was low and she is taking supplement.  Current Outpatient Medications  Medication Sig Dispense Refill   amoxicillin (AMOXIL) 875 MG tablet TAKE 1 TABLET BY MOUTH EVERY MORNING AND EVERY EVENING FOR 10 DAYS     Cholecalciferol 1.25 MG (50000 UT) TABS Take 1.25 mg by mouth once a week. 8 tablet 0   fluconazole (DIFLUCAN) 150 MG tablet TAKE 1 TABLET BY MOUTH FOR 1 DOSE, MAY REPEAT IN 72 HRS IF SYMPTOMS ARE STILL PRESENT     fluticasone (FLONASE) 50 MCG/ACT nasal spray Place 2 sprays into both nostrils daily. 16 g 6   hydrOXYzine (ATARAX) 25 MG tablet Take 1 tablet (25 mg total) by mouth 3 (three) times daily as needed for anxiety. 20 tablet 0   ibuprofen (ADVIL,MOTRIN) 800 MG tablet Take 1 tablet (800 mg total) by mouth every 8 (eight) hours as needed. 30 tablet 0   loratadine (CLARITIN) 10 MG tablet Take 1 tablet (10 mg total) by mouth daily. In the morning 90 tablet 1   lubiprostone (AMITIZA) 24 MCG capsule Take 1 capsule (24 mcg total) by mouth 2 (two) times daily with a meal. 60 capsule 5    methylPREDNISolone (MEDROL) 4 MG tablet Take 1 dose pk by oral route.     nitrofurantoin, macrocrystal-monohydrate, (MACROBID) 100 MG capsule SMARTSIG:1 Capsule(s) By Mouth Morning-Night     omeprazole (PRILOSEC) 40 MG capsule Take 1 capsule (40 mg total) by mouth daily. 30 capsule 5   ondansetron (ZOFRAN) 4 MG tablet Take 1 tablet twice a day by oral route as needed.     oxyCODONE (OXY IR/ROXICODONE) 5 MG immediate release tablet Take 1 tablet every 4-6 hours by oral route as needed.     No current facility-administered medications for this visit.    Allergies as of 01/13/2024   (No Known Allergies)    Past Medical History:  Diagnosis Date   Anxiety    Fracture of phalanx of finger 12/29/2023    Past Surgical History:  Procedure Laterality Date   OTHER SURGICAL HISTORY     left leg surgery   WISDOM TOOTH EXTRACTION      Review of Systems:    All systems reviewed and negative except where noted in HPI.   Physical Examination:   There were no vitals taken for this visit.  General: Well-nourished, well-developed in no acute distress.  Lungs: Clear to auscultation bilaterally. Non-labored. Heart: Regular rate and rhythm, no murmurs rubs or gallops.  Abdomen: Bowel sounds are normal; Abdomen is Soft; No hepatosplenomegaly, masses or hernias;  No Abdominal Tenderness; No guarding or rebound tenderness. Neuro: Alert  and oriented x 3.  Grossly intact.  Psych: Alert and cooperative, normal mood and affect.   Imaging Studies: No results found.  Assessment and Plan:   BROOKLYNN DIVENS is a 23 y.o. y/o female ***    Celso Amy, PA-C  Follow up ***  BP check ***

## 2024-01-13 ENCOUNTER — Ambulatory Visit: Payer: 59 | Admitting: Physician Assistant

## 2024-01-19 DIAGNOSIS — S62521A Displaced fracture of distal phalanx of right thumb, initial encounter for closed fracture: Secondary | ICD-10-CM | POA: Diagnosis not present

## 2024-02-16 DIAGNOSIS — S62521A Displaced fracture of distal phalanx of right thumb, initial encounter for closed fracture: Secondary | ICD-10-CM | POA: Diagnosis not present

## 2024-03-07 ENCOUNTER — Telehealth: Payer: Self-pay

## 2024-03-07 NOTE — Telephone Encounter (Signed)
 Copied from CRM [id number]. Topic: Clinical - Request for Lab/Test Order >> Mar 07, 2024 12:20 PM Brianna Ellis wrote: Reason for CRM: patient wants to know if she can have her follow up imaging ordered for her stomach, please call to advise.

## 2024-03-09 ENCOUNTER — Ambulatory Visit: Payer: Self-pay | Admitting: Physician Assistant

## 2024-03-09 DIAGNOSIS — R1031 Right lower quadrant pain: Secondary | ICD-10-CM | POA: Diagnosis not present

## 2024-03-09 DIAGNOSIS — R399 Unspecified symptoms and signs involving the genitourinary system: Secondary | ICD-10-CM | POA: Diagnosis not present

## 2024-03-09 DIAGNOSIS — R1032 Left lower quadrant pain: Secondary | ICD-10-CM | POA: Diagnosis not present

## 2024-03-09 NOTE — Telephone Encounter (Signed)
 Copied from CRM [id number]. Topic: Clinical - Red Word Triage >> Mar 09, 2024  9:18 AM Turkey B wrote: [facility] that prompted transfer to Nurse Triage: pt has pain below belly button and bloating  Chief Complaint: abd pain on going Symptoms: pain, bloating Frequency: constant; states has referral to GI Pertinent Negatives: Patient denies fever, painful urination, diarrhea Disposition: [] ED /[] Urgent Care (no appt availability in office) / [] Appointment(In office/virtual)/ []  [facility]/ [] Home Care/ [] Refused Recommended Disposition /[] [facility]/ [x]  Follow-up with PCP Additional Notes: per protocol to be seen within 24 hours; message sent to office for assistance with scheduling as apt populate but are not available for scheduling; care advice given, denies questions; instructed to go to ER if becomes worse.   Reason for Disposition  [1] MILD pain (e.g., does not interfere with normal activities) AND [2] pain comes and goes (cramps) AND [3] present > 48 hours  (Exception: This same abdominal pain is a chronic symptom recurrent or ongoing AND present > 4 weeks.)  Answer Assessment - Initial Assessment Questions 1. LOCATION: "Where does it hurt?"      Lower right under bellybutton and to the side 2. RADIATION: "Does the pain shoot anywhere else?" (e.g., chest, back)     Yes to back 3. ONSET: "When did the pain begin?" (e.g., minutes, hours or days ago)      Over the last month and bloating 4. SUDDEN: "Gradual or sudden onset?"     suddenly 5. PATTERN "Does the pain come and go, or is it constant?"    - If it comes and goes: "How long does it last?" "Do you have pain now?"     (Note: Comes and goes means the pain is intermittent. It goes away completely between bouts.)    - If constant: "Is it getting better, staying the same, or getting worse?"      (Note: Constant means the pain never goes away completely; most serious pain is constant and gets worse.)      Comes  and goes 6. SEVERITY: "How bad is the pain?"  (e.g., Scale 1-10; mild, moderate, or severe)    - MILD (1-3): Doesn't interfere with normal activities, abdomen soft and not tender to touch.     - MODERATE (4-7): Interferes with normal activities or awakens from sleep, abdomen tender to touch.     - SEVERE (8-10): Excruciating pain, doubled over, unable to do any normal activities.       6/10 7. RECURRENT SYMPTOM: "Have you ever had this type of stomach pain before?" If Yes, ask: "When was the last time?" and "What happened that time?"      denies 8. CAUSE: "What do you think is causing the stomach pain?"     denies 9. RELIEVING/AGGRAVATING FACTORS: "What makes it better or worse?" (e.g., antacids, bending or twisting motion, bowel movement)     Heating pad helps  10. OTHER SYMPTOMS: "Do you have any other symptoms?" (e.g., back pain, diarrhea, fever, urination pain, vomiting)       nauseous 11. PREGNANCY: "Is there any chance you are pregnant?" "When was your last menstrual period?"       no  Protocols used: Abdominal Pain - [facility]

## 2024-03-09 NOTE — Telephone Encounter (Signed)
 Patient reports that she went to [facility] today during lunch and they did an xray which it was normal. They advised patient to call GYN and GI for consultation. Patient is going to call them and scheduled appointment and will call us with any concerns.

## 2024-03-14 ENCOUNTER — Encounter: Payer: Self-pay | Admitting: Obstetrics and Gynecology

## 2024-03-14 ENCOUNTER — Ambulatory Visit (INDEPENDENT_AMBULATORY_CARE_PROVIDER_SITE_OTHER): Payer: Self-pay | Admitting: Obstetrics and Gynecology

## 2024-03-14 ENCOUNTER — Other Ambulatory Visit (HOSPITAL_COMMUNITY)
Admission: RE | Admit: 2024-03-14 | Discharge: 2024-03-14 | Disposition: A | Discharge status: Home / Self Care | Payer: Self-pay | Source: Ambulatory Visit | Attending: Obstetrics and Gynecology | Admitting: Obstetrics and Gynecology

## 2024-03-14 VITALS — BP 119/74 | HR 73 | Ht 68.0 in | Wt 170.0 lb

## 2024-03-14 DIAGNOSIS — R1031 Right lower quadrant pain: Secondary | ICD-10-CM | POA: Diagnosis not present

## 2024-03-14 DIAGNOSIS — N920 Excessive and frequent menstruation with regular cycle: Secondary | ICD-10-CM | POA: Diagnosis not present

## 2024-03-14 DIAGNOSIS — Z113 Encounter for screening for infections with a predominantly sexual mode of transmission: Secondary | ICD-10-CM | POA: Diagnosis not present

## 2024-03-14 DIAGNOSIS — Z87440 Personal history of urinary (tract) infections: Secondary | ICD-10-CM | POA: Diagnosis not present

## 2024-03-14 LAB — POCT URINALYSIS DIPSTICK
Bilirubin, UA: NEGATIVE
Blood, UA: NEGATIVE
Glucose, UA: NEGATIVE
Ketones, UA: NEGATIVE
Nitrite, UA: NEGATIVE
Protein, UA: NEGATIVE
Spec Grav, UA: 1.02 (ref 1.010–1.025)
pH, UA: 6 (ref 5.0–8.0)

## 2024-03-14 LAB — POCT URINE PREGNANCY: Preg Test, Ur: NEGATIVE

## 2024-03-14 NOTE — Progress Notes (Signed)
 Brianna Lat, PA-C   Chief Complaint  Patient presents with   Pelvic Pain    Severe pain mostly on right side x couple of months   Contraception    Would like to discuss/start [facility]    HPI:      Brianna Ellis is a 23 y.o. G0P0000 whose LMP was Patient's last menstrual period was 02/27/2024 (approximate)., presents today for RLQ pain for the past 2 months. Sx are sharp and fleeting, lasting a couple minutes, multiple times daily. Then has some dull achy pain. No aggrav factors but sx increase if active when has sx. Has done heating pad with some relief. Also feels bloated on RT side and has some constipation.  Menses are monthly, lasting 3-4 days, mod flow, now with clots for a few months, no BTB, mod dysmen, improved with NSAIDs/heating pad. Feels like flow heavier past few months.  She is sexually active, not using BC. No pain/bleeding. No vag sx, no recent STD testing. Would like nexplanon vs IUD. No hx of HTN, DVTs, migraines with aura. Did OCPs for a short course in past.  Went to urgent care for pelvic pain last wk and diagnosed with UTI. Klebsiella on C&S, treated with cefdinir. Has a day left but still with urinary frequency with good flow; no other UTI sx.    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   Encounter for counseling regarding contraception 01/19/2020    Past Surgical History:  Procedure Laterality Date   OTHER SURGICAL HISTORY     left leg surgery   WISDOM TOOTH EXTRACTION       Family History  Problem Relation Age of Onset   Diabetes Mother    Ovarian cancer Maternal Grandmother        unknown age   Hypertension Maternal Grandfather    Breast cancer Paternal Great-grandmother        unknown age    Social History   Socioeconomic History   Marital status: Single    Spouse name: Not on file   Number of children: Not on file   Years of education: Not on file   Highest education level: Not on file  Occupational History   Not on file  Tobacco Use   Smoking status: Never   Smokeless tobacco: Never  Vaping Use   Vaping status: Every Day   Start date: 12/21/2017   Substances: Nicotine, Flavoring  Substance and Sexual Activity   Alcohol use: Yes    Comment: occasionaly   Drug use: Not Currently    Types: Marijuana   Sexual activity: Yes    Birth control/protection: None  Other Topics Concern   Not on file  Social History Narrative   Not on file   Social Drivers of Health   Financial Resource Strain: Not on file  Food Insecurity: Not on file  Transportation Needs: Not on file  Physical Activity: Not on file  Stress: Not on file  Social Connections: Not on file  Intimate Partner Violence: Not on  file    Outpatient Medications Prior to Visit  Medication Sig Dispense Refill   cefdinir (OMNICEF) 300 MG capsule Take by mouth.     Cholecalciferol 1.25 MG (50000 UT) TABS Take 1.25 mg by mouth once a week. 8 tablet 0   hydrOXYzine (ATARAX) 25 MG tablet Take 1 tablet (25 mg total) by mouth 3 (three) times daily as needed for anxiety. 20 tablet 0   ibuprofen (ADVIL,MOTRIN) 800 MG tablet Take 1 tablet (800 mg total) by mouth every 8 (eight) hours as needed. 30 tablet 0   loratadine (CLARITIN) 10 MG tablet Take 1 tablet (10 mg total) by mouth daily. In the morning 90 tablet 1   lubiprostone (AMITIZA) 24 MCG capsule Take 1 capsule (24 mcg total) by mouth 2 (two) times daily with a meal. 60 capsule 5   ondansetron (ZOFRAN) 4 MG tablet Take 1 tablet  twice a day by oral route as needed.     amoxicillin (AMOXIL) 875 MG tablet TAKE 1 TABLET BY MOUTH EVERY MORNING AND EVERY EVENING FOR 10 DAYS     fluconazole (DIFLUCAN) 150 MG tablet TAKE 1 TABLET BY MOUTH FOR 1 DOSE, MAY REPEAT IN 72 HRS IF SYMPTOMS ARE STILL PRESENT     fluticasone (FLONASE) 50 MCG/ACT nasal spray Place 2 sprays into both nostrils daily. 16 g 6   methylPREDNISolone (MEDROL) 4 MG tablet Take 1 dose pk by oral route.     nitrofurantoin, macrocrystal-monohydrate, (MACROBID) 100 MG capsule SMARTSIG:1 Capsule(s) By Mouth Morning-Night     omeprazole (PRILOSEC) 40 MG capsule Take 1 capsule (40 mg total) by mouth daily. 30 capsule 5   oxyCODONE (OXY IR/ROXICODONE) 5 MG immediate release tablet Take 1 tablet every 4-6 hours by oral route as needed.     No facility-administered medications prior to visit.      ROS:  Review of Systems  Constitutional:  Negative for fever.  Gastrointestinal:  Positive for constipation. Negative for blood in stool, diarrhea, nausea and vomiting.  Genitourinary:  Positive for frequency and pelvic pain. Negative for dyspareunia, dysuria, flank pain, hematuria, urgency, vaginal bleeding, vaginal discharge and vaginal pain.  Musculoskeletal:  Negative for back pain.  Skin:  Negative for rash.   BREAST: No symptoms   OBJECTIVE:   Vitals:  BP 119/74   Pulse 73   Ht 5\' 8"  (1.727 m)   Wt 170 lb (77.1 kg)   LMP 02/27/2024 (Approximate)   BMI 25.85 kg/m   Physical Exam Vitals reviewed.  Constitutional:      Appearance: She is well-developed.  Pulmonary:     Effort: Pulmonary effort is normal.  Genitourinary:    General: Normal vulva.     Pubic Area: No rash.      Labia:        Right: No rash, tenderness or lesion.        Left: No rash, tenderness or lesion.      Vagina: Normal. No vaginal discharge, erythema or tenderness.     Cervix: Normal.     Uterus: Normal. Not enlarged and not tender.      Adnexa: Left adnexa normal.        Right: Tenderness present. No mass.         Left: No mass or tenderness.    Musculoskeletal:        General: Normal range of motion.     Cervical back: Normal range of motion.  Skin:    General: Skin is warm and dry.  Neurological:  General: No focal deficit present.     Mental Status: She is alert and oriented to person, place, and time.  Psychiatric:        Mood and Affect: Mood normal.        Behavior: Behavior normal.        Thought Content: Thought content normal.        Judgment: Judgment normal.     Results: Results for orders placed or performed in visit on 03/14/24 (from the past 24 hours)  POCT Urinalysis Dipstick     Status: Abnormal   Collection Time: 03/14/24  5:11 PM  Result Value Ref Range   Color, UA yellow    Clarity, UA cloudy    Glucose, UA Negative Negative   Bilirubin, UA neg    Ketones, UA neg    Spec Grav, UA 1.020 1.010 - 1.025   Blood, UA neg    pH, UA 6.0 5.0 - 8.0   Protein, UA Negative Negative   Urobilinogen, UA     Nitrite, UA neg    Leukocytes, UA Moderate (2+) (A) Negative   Appearance     Odor    POCT urine pregnancy     Status: Normal   Collection Time: 03/14/24  5:11 PM  Result Value Ref Range   Preg Test, Ur Negative Negative     Assessment/Plan: RLQ abdominal pain - Plan: US PELVIS TRANSVAGINAL NON-OB (TV ONLY); for 2 months, neg UPT, tender on exam. Check GYN u/s, rule out STDs. Will f/u with results. If neg, question constipation.   Menorrhagia with regular cycle - Plan: US PELVIS TRANSVAGINAL NON-OB (TV ONLY); check Gyn u/s. Discussed nexplanon vs Kyleea IUD. Pt to consider and f/u with menses for insertion. If doing IUD, will also send in cytotec Rx.   Screening for STD (sexually transmitted disease) - Plan: Cervicovaginal ancillary only  History of UTI - Plan: Urine Culture, POCT Urinalysis Dipstick; on cefdinir, still pos UA. Check C&S.     Return in about 1 day (around 03/15/2024) for GYN u/s for RLQ pain--[facility] to call  with results.  Danicka Hourihan B. Danial Hlavac, PA-C 03/14/2024 5:12 PM

## 2024-03-14 NOTE — Patient Instructions (Signed)
 I value your feedback and you entrusting [place] with your care. If you get a [facility] patient survey, I would appreciate you taking the time to let us know about your experience today. Thank you! ? ? ?

## 2024-03-15 LAB — CERVICOVAGINAL ANCILLARY ONLY
Chlamydia: NEGATIVE
Comment: NEGATIVE
Comment: NORMAL
Neisseria Gonorrhea: NEGATIVE

## 2024-03-17 LAB — URINE CULTURE

## 2024-03-19 ENCOUNTER — Encounter: Payer: Self-pay | Admitting: Obstetrics and Gynecology

## 2024-03-19 MED ORDER — SULFAMETHOXAZOLE-TRIMETHOPRIM 800-160 MG PO TABS: 1.0000 | ORAL_TABLET | Freq: Two times a day (BID) | ORAL | 0 refills | Status: AC

## 2024-03-19 NOTE — Addendum Note (Signed)
 Addended by: Althea Grimmer B on: 03/19/2024 01:57 PM   Modules accepted: Orders

## 2024-04-13 ENCOUNTER — Ambulatory Visit: Admitting: Obstetrics and Gynecology

## 2024-04-13 ENCOUNTER — Ambulatory Visit (INDEPENDENT_AMBULATORY_CARE_PROVIDER_SITE_OTHER)

## 2024-04-13 ENCOUNTER — Encounter: Payer: Self-pay | Admitting: Obstetrics and Gynecology

## 2024-04-13 ENCOUNTER — Ambulatory Visit
Admission: RE | Admit: 2024-04-13 | Discharge: 2024-04-13 | Disposition: A | Discharge status: Home / Self Care | Source: Ambulatory Visit | Attending: Obstetrics and Gynecology | Admitting: Obstetrics and Gynecology

## 2024-04-13 VITALS — BP 118/74 | Ht 68.0 in | Wt 167.0 lb

## 2024-04-13 DIAGNOSIS — R1031 Right lower quadrant pain: Secondary | ICD-10-CM | POA: Diagnosis not present

## 2024-04-13 DIAGNOSIS — N644 Mastodynia: Secondary | ICD-10-CM | POA: Diagnosis not present

## 2024-04-13 DIAGNOSIS — N920 Excessive and frequent menstruation with regular cycle: Secondary | ICD-10-CM | POA: Diagnosis not present

## 2024-04-13 DIAGNOSIS — N6321 Unspecified lump in the left breast, upper outer quadrant: Secondary | ICD-10-CM | POA: Diagnosis not present

## 2024-04-13 NOTE — Patient Instructions (Signed)
 I value your feedback and you entrusting [place] with your care. If you get a [facility] patient survey, I would appreciate you taking the time to let us know about your experience today. Thank you! ? ? ?

## 2024-04-13 NOTE — Progress Notes (Signed)
 Ostwalt, Janna, PA-C   Chief Complaint  Patient presents with   Breast Exam    LB swollen, very sore x 3 days    HPI:      [name] is a 23 y.o. G0P0000 whose LMP was Patient's last menstrual period was 03/30/2024 (approximate)., presents today for LT breast pain and fullness for 3 days in UOQ. Very tender in 1 area, no discrete masses. No erythema/nipple d/c/trauma. Has taken ibup with some improvement. Pt drinks 1 caffeine drink daily. Does get some breast tenderness before menses but pt is mid-cycle now and sx are worse than before menses. No hx of breast masses. FH breast cancer in [facility].  Treated for UTI 3/25 with sx relief.  Has GYN u/s later today for RLQ pain; sx improving.   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   Encounter for counseling regarding contraception 01/19/2020    Past Surgical History:  Procedure Laterality Date   OTHER SURGICAL HISTORY     left leg surgery   WISDOM TOOTH EXTRACTION      Family History  Problem Relation Age of Onset   Diabetes Mother    Ovarian cancer Maternal Grandmother        unknown age   Hypertension Maternal Grandfather    Breast cancer Paternal Great-grandmother        unknown age    Social History   Socioeconomic History   Marital status: Single    Spouse name: Not on file   Number of children: Not on file   Years of education: Not on file   Highest education level: Not on file  Occupational History   Not on file  Tobacco Use    Smoking status: Never   Smokeless tobacco: Never  Vaping Use   Vaping status: Every Day   Start date: 12/21/2017   Substances: Nicotine, Flavoring  Substance and Sexual Activity   Alcohol use: Yes    Comment: occasionaly   Drug use: Not Currently    Types: Marijuana   Sexual activity: Yes    Birth control/protection: None  Other Topics Concern   Not on file  Social History Narrative   Not on file   Social Drivers of Health   Financial Resource Strain: Not on file  Food Insecurity: Not on file  Transportation Needs: Not on file  Physical Activity: Not on file  Stress: Not on file  Social Connections: Not on file  Intimate Partner Violence: Not on file    Outpatient Medications Prior to Visit  Medication Sig Dispense Refill   Cholecalciferol  1.25 MG (50000 UT) TABS Take 1.25 mg by mouth once a week. 8 tablet 0   hydrOXYzine  (ATARAX ) 25 MG tablet Take 1 tablet (25 mg total) by mouth 3 (three) times daily as needed for anxiety. 20 tablet 0   ibuprofen  (ADVIL ,MOTRIN ) 800 MG tablet Take 1 tablet (800 mg total) by mouth every 8 (eight) hours as needed. 30 tablet 0   loratadine  (CLARITIN ) 10 MG tablet Take 1  tablet (10 mg total) by mouth daily. In the morning 90 tablet 1   lubiprostone  (AMITIZA ) 24 MCG capsule Take 1 capsule (24 mcg total) by mouth 2 (two) times daily with a meal. 60 capsule 5   ondansetron  (ZOFRAN ) 4 MG tablet Take 1 tablet twice a day by oral route as needed.     sulfamethoxazole -trimethoprim  (BACTRIM  DS) 800-160 MG tablet Take 1 tablet by mouth 2 (two) times daily.     No facility-administered medications prior to visit.      ROS:  Review of Systems  Constitutional:  Negative for fever.  Gastrointestinal:  Negative for blood in stool, constipation, diarrhea, nausea and vomiting.  Genitourinary:  Negative for dyspareunia, dysuria, flank pain, frequency, hematuria, urgency, vaginal bleeding, vaginal discharge and vaginal pain.  Musculoskeletal:  Negative for  back pain.  Skin:  Negative for rash.   BREAST: No symptoms   OBJECTIVE:   Vitals:  BP 118/74   Ht 5\' 8"  (1.727 m)   Wt 167 lb (75.8 kg)   LMP 03/30/2024 (Approximate)   BMI 25.39 kg/m   Physical Exam Vitals reviewed.  Pulmonary:     Effort: Pulmonary effort is normal.  Chest:  Breasts:    Breasts are symmetrical.     Right: No inverted nipple, mass, nipple discharge, skin change or tenderness.     Left: Tenderness present. No inverted nipple, mass, nipple discharge or skin change.    Musculoskeletal:        General: Normal range of motion.     Cervical back: Normal range of motion.  Skin:    General: Skin is warm and dry.  Neurological:     General: No focal deficit present.     Mental Status: She is alert and oriented to person, place, and time.     Cranial Nerves: No cranial nerve deficit.  Psychiatric:        Mood and Affect: Mood normal.        Behavior: Behavior normal.        Thought Content: Thought content normal.        Judgment: Judgment normal.     Assessment/Plan: Breast pain, left - Plan: US  LIMITED ULTRASOUND INCLUDING AXILLA LEFT BREAST--tender to palpate LUOQ (brought pt to tears). Pt to schedule LT breast u/s, will f/u with results. NSAIDs/heating pad in meantime. F/u prn.     Return if symptoms worsen or fail to improve.  Emelda Kohlbeck B. Loyola Santino, PA-C 04/13/2024 12:00 PM

## 2024-04-14 ENCOUNTER — Other Ambulatory Visit: Payer: Self-pay | Admitting: Obstetrics and Gynecology

## 2024-04-14 MED ORDER — MISOPROSTOL 100 MCG PO TABS: 100.0000 ug | ORAL_TABLET | Freq: Once | ORAL | 0 refills | Status: AC

## 2024-04-14 NOTE — Progress Notes (Signed)
 Rx cytotec. Pt has decided to get Kyleena. Will call with next menses for insertion. LMP was more normal and not as heavy as it has been. Neg GYN u/s yesterday.

## 2024-04-16 DIAGNOSIS — J069 Acute upper respiratory infection, unspecified: Secondary | ICD-10-CM | POA: Diagnosis not present

## 2024-07-03 ENCOUNTER — Other Ambulatory Visit: Payer: Self-pay

## 2024-07-03 NOTE — Telephone Encounter (Signed)
 Pt established care with you and her last OV was 11/2023... Pt states she is out of Amitiza  and is requesting a refill... I let pt know that you may require a f/u appt as you have moved offices... Pt would like to know if a video visit would suffice and if she could get a refill in the meantime... Please advise

## 2024-07-04 ENCOUNTER — Telehealth: Payer: Self-pay | Admitting: Physician Assistant

## 2024-07-04 NOTE — Telephone Encounter (Signed)
 Good afternoon Brianna Ellis   Inbound call from patient requesting a medication refill for lubiprostone .   Patient previously seen with [facility] and has been directed to call our practice.   Can you please advise.

## 2024-07-05 MED ORDER — LUBIPROSTONE 24 MCG PO CAPS: 24.0000 ug | ORAL_CAPSULE | Freq: Two times a day (BID) | ORAL | 1 refills | Status: AC

## 2024-07-05 NOTE — Telephone Encounter (Signed)
My chart video visit  scheduled

## 2024-08-01 ENCOUNTER — Ambulatory Visit: Payer: Self-pay

## 2024-08-01 ENCOUNTER — Ambulatory Visit: Payer: Self-pay | Admitting: *Deleted

## 2024-08-01 NOTE — Telephone Encounter (Signed)
 Pt called to reschedule appt: call transferred to NT: canceled appt made by previous NT and scheduled an appointment with Urgent care for today at 1:45 pm

## 2024-08-01 NOTE — Telephone Encounter (Signed)
 Copied from CRM [id number]. Topic: Clinical - Red Word Triage >> Aug 01, 2024 10:27 AM [name] wrote: [facility] that prompted transfer to Nurse Triage: Pt states that there was blood mucus on her stool yesterday, and she also noticed foamy bubbles in her urine, which is irregular for her. Reason for Disposition  MILD rectal bleeding (e.g., more than just a few drops or streaks)  Answer Assessment - Initial Assessment Questions 1. APPEARANCE of BLOOD: What color is it? Is it passed separately, on the surface of the stool, or mixed in with the stool?      I saw blood on my stool yesterday.   It was mucus blood.     I have problems with constipation.  I feel like my intestines are cramping up when I need to go to the bathroom.   I also have foamy bubbles around it.    I'm having abd cramping.   Last BM yesterday that's when I saw the blood.   I can't go to the bathroom without using the medicine.    2. AMOUNT: How much blood was passed?      A small amount with mucus 3. FREQUENCY: How many times has blood been passed with the stools?      One time.   The other times I have foamy bubbles around my stool.    4. ONSET: When was the blood first seen in the stools? (Days or weeks)      Yesterday  5. DIARRHEA: Is there also some diarrhea? If Yes, ask: How many diarrhea stools in the past 24 hours?      No 6. CONSTIPATION: Do you have constipation? If Yes, ask: How bad is it?     Yes    I have to use the medicine in order to have a BM. My stomach is so bloated after I eat. 7. RECURRENT SYMPTOMS: Have you had blood in your stools before? If Yes, ask: When was the last time? and What happened that time?      No 8. BLOOD THINNERS: Do you take any blood thinners? (e.g., aspirin, clopidogrel / Plavix, coumadin, heparin). Notes: Other strong blood thinners include: Arixtra (fondaparinux), Eliquis (apixaban), Pradaxa (dabigatran), and Xarelto (rivaroxaban).     No 9. OTHER SYMPTOMS:  Do you have any other symptoms?  (e.g., abdomen pain, vomiting, dizziness, fever)     Cramping of abd, foam around the stools.   10. PREGNANCY: Is there any chance you are pregnant? When was your last menstrual period?       Not asked  Protocols used: Rectal Bleeding-A-AH FYI Only or Action Required?: FYI only for provider.  Patient was last seen in primary care by Jolynn Spencer, PA.   Asked not to be scheduled with Janna Ostwalt, PA.   We just don't jive or something.   I'd prefer to see someone else.  Brianna Ellis Nurse Triage reporting Rectal Bleeding. Had a small amount of blood mixed with mucus in her stool on 8/11 with foamy bubbles around it.   Takes an rx to help her have BMs due to issues with constipation.  Also have abd cramping when needs to have a BM.  Symptoms began yesterday.  Interventions attempted: Prescription medications: taking the rx medication for constipation.  Symptoms are: gradually worsening.  Triage Disposition: See PCP When Office is Open (Within 3 Days)  Patient/caregiver understands and will follow disposition?: Yes

## 2024-08-01 NOTE — Telephone Encounter (Signed)
 FYI

## 2024-08-02 ENCOUNTER — Ambulatory Visit: Admitting: Family Medicine

## 2024-09-10 NOTE — Progress Notes (Unsigned)
 Brianna Console, PA-C [address] [place], [place]  [address] Phone: [phone]   Primary Care Physician: Ellis, Janna, PA-C  Virtual Visit via Video Note  I connected with patient on 09/10/24 at  9:20 AM EDT by video and verified that I am speaking with the correct person using two identifiers.  Location of Patient: Home Location of Provider: [facility] Office Exam Room Persons involved: Patient and provider only   History of Present Illness: No chief complaint on file.   HPI: Brianna Ellis is a 23 y.o. female presents to followup for chronic constipation.  She is currently taking Amitiza  24 mcg twice daily with benefit and needs medication refill.  I last saw patient at [facility] gastroenterology 11/2023.  She has history of irritable bowel syndrome, constipation predominant.  In the past she tried MiraLAX  and Dulcolax with little benefit.  Amitiza  is working well.  She has taken Zofran  4 Mg as needed for nausea.  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  She was referred to vascular surgeon.  Patient is followed by OB/GYN.   06/2023 labs: Normal CBC CMP, TSH, free T4.  Negative hCG.  No history of anemia.  No previous EGD or colonoscopy.  She denies family history of colon cancer or colon polyps.  11/2023 H. pylori breath test negative.  hCG negative.  03/2024 pelvic transvaginal ultrasound: Normal.  Followed at [facility].     Current Outpatient Medications  Medication Sig Dispense Refill   Cholecalciferol  1.25 MG (50000 UT) TABS Take 1.25 mg by mouth once a week. 8 tablet 0   hydrOXYzine  (ATARAX ) 25 MG tablet Take 1 tablet (25 mg total) by mouth 3 (three) times daily as needed for anxiety. 20 tablet 0   ibuprofen  (ADVIL ,MOTRIN ) 800 MG tablet Take 1 tablet (800 mg total) by mouth every 8 (eight) hours as needed. 30 tablet 0   loratadine  (CLARITIN ) 10 MG tablet Take 1 tablet (10  mg total) by mouth daily. In the morning 90 tablet 1   lubiprostone  (AMITIZA ) 24 MCG capsule Take 1 capsule (24 mcg total) by mouth 2 (two) times daily with a meal. 60 capsule 1   misoprostol  (CYTOTEC ) 100 MCG tablet Take 1 tablet (100 mcg total) by mouth once for 1 dose. 1 hour before appt 1 tablet 0   ondansetron  (ZOFRAN ) 4 MG tablet Take 1 tablet twice a day by oral route as needed.     sulfamethoxazole -trimethoprim  (BACTRIM  DS) 800-160 MG tablet Take 1 tablet by mouth 2 (two) times daily.     No current facility-administered medications for this visit.    Allergies as of 09/11/2024   (No Known Allergies)    Review of Systems:    All systems reviewed and negative except where noted in HPI.  General Appearance:    Alert, cooperative, no distress, appears stated age  Head:    Normocephalic, without obvious abnormality, atraumatic  Eyes:    PERRL, conjunctiva/corneas clear,  Ears:    Grossly normal hearing    Neurologic:  Grossly normal    Observations/Objective:  Labs: CMP     Component Value Date/Time   NA 142 06/29/2023 1004   K 4.2 06/29/2023 1004   CL 106 06/29/2023 1004   CO2 23 06/29/2023 1004   GLUCOSE 83 06/29/2023 1004   GLUCOSE 93 09/27/2022 1208   BUN 8 06/29/2023 1004   CREATININE 0.89 06/29/2023 1004   CALCIUM 9.6 06/29/2023  1004   PROT 7.3 06/29/2023 1004   ALBUMIN 4.4 06/29/2023 1004   AST 11 06/29/2023 1004   ALT 6 06/29/2023 1004   ALKPHOS 53 06/29/2023 1004   BILITOT 0.3 06/29/2023 1004   GFRNONAA >60 09/27/2022 1208   GFRAA >60 04/05/2020 2321   Lab Results  Component Value Date   WBC 4.5 09/27/2022   HGB 12.3 09/27/2022   HCT 37.1 09/27/2022   MCV 88.1 09/27/2022   PLT 206 09/27/2022    Imaging Studies: No results found.  Assessment and Plan:   Brianna Ellis is a 23 y.o. y/o female returns for follow-up of  1.  Irritable bowel syndrome, constipation predominant - Refilled Amitiza  24 mcg twice daily  2.  Chronic  constipation   Follow-up in 1 year     I discussed the assessment and treatment plan with the patient. The patient was provided an opportunity to ask questions and all were answered. The patient agreed with the plan and demonstrated an understanding of the instructions.   The patient was advised to call back or seek an in-person evaluation if the symptoms worsen or if the condition fails to improve as anticipated.  I provided *** minutes of face-to-face time during this encounter.  Brianna Console, PA-C [facility]   Speech recognition software was used to dictate this note.

## 2024-09-11 ENCOUNTER — Telehealth (INDEPENDENT_AMBULATORY_CARE_PROVIDER_SITE_OTHER): Admitting: Physician Assistant

## 2024-09-11 ENCOUNTER — Telehealth: Payer: Self-pay

## 2024-09-11 DIAGNOSIS — K5900 Constipation, unspecified: Secondary | ICD-10-CM

## 2024-09-11 NOTE — Telephone Encounter (Signed)
 MyChart message
